# Patient Record
Sex: Female | Born: 1965 | Race: White | Hispanic: No | Marital: Single | Smoking: Former smoker
Health system: Southern US, Community
[De-identification: ages and names within clinical notes are randomized; demographics above are authoritative.]

## PROBLEM LIST (undated history)

## (undated) DIAGNOSIS — F32A Depression, unspecified: Secondary | ICD-10-CM

## (undated) DIAGNOSIS — I10 Essential (primary) hypertension: Secondary | ICD-10-CM

## (undated) DIAGNOSIS — I219 Acute myocardial infarction, unspecified: Secondary | ICD-10-CM

## (undated) DIAGNOSIS — B009 Herpesviral infection, unspecified: Secondary | ICD-10-CM

## (undated) DIAGNOSIS — I2542 Coronary artery dissection: Secondary | ICD-10-CM

## (undated) DIAGNOSIS — E210 Primary hyperparathyroidism: Secondary | ICD-10-CM

## (undated) DIAGNOSIS — F329 Major depressive disorder, single episode, unspecified: Secondary | ICD-10-CM

## (undated) DIAGNOSIS — F419 Anxiety disorder, unspecified: Secondary | ICD-10-CM

## (undated) DIAGNOSIS — G4730 Sleep apnea, unspecified: Secondary | ICD-10-CM

## (undated) DIAGNOSIS — R87629 Unspecified abnormal cytological findings in specimens from vagina: Secondary | ICD-10-CM

## (undated) DIAGNOSIS — R7303 Prediabetes: Secondary | ICD-10-CM

## (undated) HISTORY — DX: Sleep apnea, unspecified: G47.30

## (undated) HISTORY — DX: Herpesviral infection, unspecified: B00.9

## (undated) HISTORY — DX: Essential (primary) hypertension: I10

## (undated) HISTORY — DX: Depression, unspecified: F32.A

---
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
## 2003-11-23 ENCOUNTER — Inpatient Hospital Stay: Payer: Self-pay | Admitting: Obstetrics and Gynecology

## 2012-03-22 ENCOUNTER — Ambulatory Visit: Payer: Self-pay | Admitting: Family Medicine

## 2012-04-08 ENCOUNTER — Ambulatory Visit: Payer: Self-pay | Admitting: Family Medicine

## 2013-01-30 DIAGNOSIS — I2542 Coronary artery dissection: Secondary | ICD-10-CM

## 2013-01-30 HISTORY — DX: Coronary artery dissection: I25.42

## 2013-04-07 DIAGNOSIS — E7800 Pure hypercholesterolemia, unspecified: Secondary | ICD-10-CM | POA: Insufficient documentation

## 2013-04-10 ENCOUNTER — Ambulatory Visit: Payer: Self-pay | Admitting: Family Medicine

## 2013-07-13 DIAGNOSIS — G4733 Obstructive sleep apnea (adult) (pediatric): Secondary | ICD-10-CM | POA: Insufficient documentation

## 2013-10-03 ENCOUNTER — Observation Stay: Payer: Self-pay | Admitting: Internal Medicine

## 2013-10-03 DIAGNOSIS — I214 Non-ST elevation (NSTEMI) myocardial infarction: Secondary | ICD-10-CM | POA: Insufficient documentation

## 2013-10-03 DIAGNOSIS — I2542 Coronary artery dissection: Secondary | ICD-10-CM | POA: Insufficient documentation

## 2013-10-03 LAB — PROTIME-INR
INR: 1.1
Prothrombin Time: 13.9 secs (ref 11.5–14.7)

## 2013-10-03 LAB — DRUG SCREEN, URINE
Amphetamines, Ur Screen: NEGATIVE (ref ?–1000)
BARBITURATES, UR SCREEN: NEGATIVE (ref ?–200)
Benzodiazepine, Ur Scrn: NEGATIVE (ref ?–200)
COCAINE METABOLITE, UR ~~LOC~~: NEGATIVE (ref ?–300)
Cannabinoid 50 Ng, Ur ~~LOC~~: NEGATIVE (ref ?–50)
MDMA (ECSTASY) UR SCREEN: NEGATIVE (ref ?–500)
Methadone, Ur Screen: NEGATIVE (ref ?–300)
OPIATE, UR SCREEN: NEGATIVE (ref ?–300)
Phencyclidine (PCP) Ur S: NEGATIVE (ref ?–25)
Tricyclic, Ur Screen: NEGATIVE (ref ?–1000)

## 2013-10-03 LAB — CK-MB
CK-MB: 10.1 ng/mL — ABNORMAL HIGH (ref 0.5–3.6)
CK-MB: 13 ng/mL — ABNORMAL HIGH (ref 0.5–3.6)

## 2013-10-03 LAB — CBC
HCT: 41.2 % (ref 35.0–47.0)
HGB: 13.3 g/dL (ref 12.0–16.0)
MCH: 28.4 pg (ref 26.0–34.0)
MCHC: 32.3 g/dL (ref 32.0–36.0)
MCV: 88 fL (ref 80–100)
Platelet: 259 10*3/uL (ref 150–440)
RBC: 4.68 10*6/uL (ref 3.80–5.20)
RDW: 13.4 % (ref 11.5–14.5)
WBC: 10.2 10*3/uL (ref 3.6–11.0)

## 2013-10-03 LAB — COMPREHENSIVE METABOLIC PANEL
ALBUMIN: 3.5 g/dL (ref 3.4–5.0)
ALT: 26 U/L
Alkaline Phosphatase: 71 U/L
Anion Gap: 7 (ref 7–16)
BUN: 9 mg/dL (ref 7–18)
Bilirubin,Total: 0.7 mg/dL (ref 0.2–1.0)
Calcium, Total: 10.8 mg/dL — ABNORMAL HIGH (ref 8.5–10.1)
Chloride: 104 mmol/L (ref 98–107)
Co2: 26 mmol/L (ref 21–32)
Creatinine: 1.06 mg/dL (ref 0.60–1.30)
GLUCOSE: 150 mg/dL — AB (ref 65–99)
OSMOLALITY: 275 (ref 275–301)
POTASSIUM: 3.2 mmol/L — AB (ref 3.5–5.1)
SGOT(AST): 12 U/L — ABNORMAL LOW (ref 15–37)
SODIUM: 137 mmol/L (ref 136–145)
Total Protein: 7.5 g/dL (ref 6.4–8.2)

## 2013-10-03 LAB — TROPONIN I
TROPONIN-I: 2.1 ng/mL — AB
TROPONIN-I: 4.7 ng/mL — AB
Troponin-I: 0.04 ng/mL

## 2013-10-03 LAB — MAGNESIUM: MAGNESIUM: 1.8 mg/dL

## 2013-10-03 LAB — D-DIMER(ARMC): D-Dimer: 368 ng/ml

## 2013-10-03 LAB — HEPARIN LEVEL (UNFRACTIONATED): Anti-Xa(Unfractionated): <0.1 IU/mL — ABNORMAL LOW (ref 0.30–0.70)

## 2013-10-03 LAB — LIPASE, BLOOD: LIPASE: 75 U/L (ref 73–393)

## 2013-10-03 LAB — APTT: ACTIVATED PTT: 25.4 s (ref 23.6–35.9)

## 2014-02-01 ENCOUNTER — Ambulatory Visit: Payer: Self-pay | Admitting: Physician Assistant

## 2014-02-06 DIAGNOSIS — R079 Chest pain, unspecified: Secondary | ICD-10-CM | POA: Insufficient documentation

## 2014-05-23 NOTE — Discharge Summary (Signed)
PATIENT NAME:  Nicole Trevino, Nicole Trevino MR#:  [id number] DATE OF BIRTH:  1965-08-06  DATE OF ADMISSION:  10/03/2013 DATE OF DISCHARGE:    PRIMARY CARE PHYSICIAN: Dr. Gayland Curry.  CARDIOLOGIST: Dr. Clayborn Bigness.  The patient will be transferred to [facility] under the care of Dr. Dionne Ano  FINAL DIAGNOSES:  1. 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  2. Elevated troponin going along with myocardial infarction. Coronary arteries showed no plaque besides the dissection in the left anterior descending.  3. Hypertension.  4. Hypokalemia.  5. Sleep apnea and obesity on CPAP at night.   CURRENT MEDICATIONS: Prior to cardiac catheterization: Included aspirin 81 mg daily, metoprolol 12.5 mg daily, Lipitor 20 mg at bedtime, Dr. Clayborn Bigness made have added further medications. The patient takes acyclovir 200 mg twice a day and also a birth control pill once a day.    HOSPITAL COURSE: The patient was admitted 10/03/2013 with chest pain. Since chest pain was atypical, a CT scan of the chest was done to rule out pulmonary embolism. That was negative. Second troponin came back at 2.1. I spoke with Dr. Clayborn Bigness and took to the cardiac catheterization laboratory and saw a spontaneous dissection of the LAD. Because we do not have followup catheterization capabilities until Tuesday, September 8, Dr. Clayborn Bigness felt that it would be best to watch this patient at a tertiary care center just in case the patient has further chest pains. Dr. Dionne Ano at [facility] has accepted the patient and the patient will likely be transferred to [facility] on 10/03/2013.    TIME SPENT ON PATIENT CARE TODAY: 80 minutes.    ____________________________ Tana Conch. Leslye Peer, MD rjw:lt D: 10/03/2013 16:47:59 ET T: 10/03/2013 17:25:33 ET JOB#: [id number]  cc: Tana Conch. Leslye Peer, MD, <Dictator> Floria Raveling. Astrid Divine, MD Dwayne D. Clayborn Bigness, [place] MD ELECTRONICALLY SIGNED 10/14/2013 12:25

## 2014-05-23 NOTE — H&P (Signed)
PATIENT NAME:  Nicole Trevino, Nicole Trevino MR#:  [id number] DATE OF BIRTH:  20-May-1965  DATE OF ADMISSION:  10/03/2013  PRIMARY CARE PHYSICIAN: Floria Raveling. Astrid Divine, MD.   CHIEF COMPLAINT: Chest pain.   HISTORY OF PRESENT ILLNESS: This is a 49 year old female who walked her child to the bus stop and then walked back. She got a hot feeling, then made it in and sat on her couch and had her chest start hurting. A squeezing, sharp-type pain, 9/10 in intensity, unrelieving until EMS got nitroglycerin and that seemed to help. Currently 1/10 chest pain. No shortness of breath. Positive for nausea. No diaphoresis. She just started birth control pills last week for heavy menses and recently started on CPAP for sleep apnea.   PAST MEDICAL HISTORY:  1.  Sleep apnea on CPAP.  2.  Heavy menses.  3.  Hypertension.  4.  Herpes simplex.   PAST SURGICAL HISTORY: Two C-sections.   ALLERGIES: CEFTIN AND ERYTHROMYCIN.   SOCIAL HISTORY: Quit smoking 6 months ago. Occasional alcohol. No drug use. Works as a Art therapist.   FAMILY HISTORY: Father with hypertension. Mother with hypertension. Both grandfathers both died in their 68s of heart disease.   MEDICATIONS: Include Acyclovir 1 tablet twice a day, hydrochlorothiazide triamterene 25/37.5 one tablet daily, birth control pill 1 tablet daily.   REVIEW OF SYSTEMS: CONSTITUTIONAL: Positive for hot feeling. No fever or chills. Positive for weight gain. Positive for fatigue, but better with CPAP.  EYES: She does wear glasses.  EARS, NOSE, MOUTH AND THROAT: Positive for postnasal drip. No sore throat. No difficulty swallowing.  CARDIOVASCULAR: Positive for chest pain. No palpitations.  RESPIRATORY: No shortness of breath. No cough. No sputum. No hemoptysis.  GASTROINTESTINAL: Positive for nausea. No vomiting. Positive for lower abdominal pain with heavy menses.  GENITOURINARY: Positive for heavy menses, twice per month.   INTEGUMENTARY: Positive for rash on the left  shoulder.  NEUROLOGIC: No fainting or blackouts.  PSYCHIATRIC: No anxiety or depression.  ENDOCRINE: No thyroid problems.  HEMATOLOGIC AND LYMPHATIC: No anemia.   PHYSICAL EXAMINATION: VITAL SIGNS: Temperature 99, pulse 100, respirations 15, blood pressure 115/82, pulse oximetry 96% on room air.  GENERAL: No respiratory distress.  EYES: Conjunctivae and lids normal. Pupils equal, round, and reactive to light. Extraocular muscles intact. No nystagmus.  EARS, NOSE, MOUTH AND THROAT: Tympanic membranes, no erythema. Nasal mucosa, no erythema. Throat, no erythema, no exudate seen. Lips and gums, no lesions.  NECK: No JVD. No bruits. No lymphadenopathy. No thyromegaly. No thyroid nodules palpated.  RESPIRATORY: Clear to auscultation. No use of accessory muscles to breathe. No rhonchi, rales, or wheeze heard.  CARDIOVASCULAR SYSTEM: S1, S2 normal. No gallops, rubs, or murmurs heard. Carotid upstroke 2+ bilaterally. No bruits.  EXTREMITIES: Dorsalis pedis pulses 2+ bilaterally. No edema of the lower extremity.  ABDOMEN: Soft, nontender. No organomegaly, splenomegaly. Normoactive bowel sounds. No masses felt.  LYMPHATIC: No lymph nodes in the neck.  MUSCULOSKELETAL: No clubbing, edema, or cyanosis.  SKIN: No rashes or ulcers seen.  NEUROLOGIC: Cranial nerves II through XII grossly intact. Deep tendon reflexes 1+ bilateral lower extremities.  PSYCHIATRIC: The patient is oriented to person, place, and time.   LABORATORY AND RADIOLOGICAL DATA: Urine toxicology negative. D-dimer negative. First troponin negative. INR 1.1, magnesium 1.8, lipase 75. White blood cell count 10.2, hemoglobin and hematocrit 13.3 and 41.2, platelet count of 259,000. Glucose 150, BUN 9, creatinine 1.06, sodium 137, potassium 3.2, chloride 104, CO2 of 26, calcium 10.8. Liver function tests normal  range.   ASSESSMENT AND PLAN: 1.  Chest pain, some atypical features. Is a little reproducible when pressing over the chest, so it  could be musculoskeletal. Could be gastrointestinal-related with recently started on CPAP. Could be pulmonary embolism with recently started on birth control pills. Will get a CT scan to rule out pulmonary embolism. The ER physician started a heparin drip and given aspirin already. I will get a stress test in the a.m. if cardiac enzymes are negative and CT scan of the chest is negative.  2.  Hypokalemia. Will discontinue hydrochlorothiazide and replace potassium.  3.  Hypertension. Blood pressure currently stable.  4.  Sleep apnea and obesity. BMI 38.1. Weight loss needed. The patient will use CPAP at night.   TIME SPENT ON ADMISSION: 55 minutes.    ____________________________ Tana Conch. Leslye Peer, MD rjw:at D: 10/03/2013 14:23:33 ET T: 10/03/2013 14:38:38 ET JOB#: [id number]  cc: Tana Conch. Leslye Peer, MD, <Dictator> Floria Raveling. Astrid Divine, MD [place] MD ELECTRONICALLY SIGNED 10/08/2013 15:16

## 2015-03-17 ENCOUNTER — Other Ambulatory Visit: Payer: Self-pay | Admitting: Family Medicine

## 2015-03-17 DIAGNOSIS — Z1231 Encounter for screening mammogram for malignant neoplasm of breast: Secondary | ICD-10-CM

## 2015-04-12 ENCOUNTER — Ambulatory Visit
Admission: RE | Admit: 2015-04-12 | Discharge: 2015-04-12 | Disposition: A | Discharge status: Home / Self Care | Payer: PRIVATE HEALTH INSURANCE | Source: Ambulatory Visit | Attending: Family Medicine | Admitting: Family Medicine

## 2015-04-12 DIAGNOSIS — Z1231 Encounter for screening mammogram for malignant neoplasm of breast: Secondary | ICD-10-CM | POA: Insufficient documentation

## 2015-04-14 ENCOUNTER — Other Ambulatory Visit: Payer: Self-pay | Admitting: Family Medicine

## 2015-04-14 DIAGNOSIS — N6489 Other specified disorders of breast: Secondary | ICD-10-CM

## 2015-04-16 ENCOUNTER — Ambulatory Visit
Admission: RE | Admit: 2015-04-16 | Discharge: 2015-04-16 | Disposition: A | Discharge status: Home / Self Care | Payer: PRIVATE HEALTH INSURANCE | Source: Ambulatory Visit | Attending: Family Medicine | Admitting: Family Medicine

## 2015-04-16 DIAGNOSIS — N6489 Other specified disorders of breast: Secondary | ICD-10-CM

## 2016-03-31 ENCOUNTER — Other Ambulatory Visit: Payer: Self-pay | Admitting: Family Medicine

## 2016-03-31 DIAGNOSIS — Z1231 Encounter for screening mammogram for malignant neoplasm of breast: Secondary | ICD-10-CM

## 2016-04-12 ENCOUNTER — Ambulatory Visit
Admission: RE | Admit: 2016-04-12 | Discharge: 2016-04-12 | Disposition: A | Discharge status: Home / Self Care | Payer: PRIVATE HEALTH INSURANCE | Source: Ambulatory Visit | Attending: Family Medicine | Admitting: Family Medicine

## 2016-04-12 ENCOUNTER — Encounter: Payer: Self-pay | Admitting: Radiology

## 2016-04-12 DIAGNOSIS — Z1231 Encounter for screening mammogram for malignant neoplasm of breast: Secondary | ICD-10-CM | POA: Diagnosis present

## 2018-04-12 DIAGNOSIS — E559 Vitamin D deficiency, unspecified: Secondary | ICD-10-CM | POA: Insufficient documentation

## 2018-04-12 DIAGNOSIS — E349 Endocrine disorder, unspecified: Secondary | ICD-10-CM | POA: Insufficient documentation

## 2018-04-18 ENCOUNTER — Other Ambulatory Visit: Payer: Self-pay | Admitting: Family Medicine

## 2018-04-18 DIAGNOSIS — Z1231 Encounter for screening mammogram for malignant neoplasm of breast: Secondary | ICD-10-CM

## 2018-05-01 DIAGNOSIS — E210 Primary hyperparathyroidism: Secondary | ICD-10-CM | POA: Insufficient documentation

## 2018-05-01 DIAGNOSIS — R5383 Other fatigue: Secondary | ICD-10-CM | POA: Insufficient documentation

## 2018-08-23 ENCOUNTER — Ambulatory Visit
Admission: RE | Admit: 2018-08-23 | Discharge: 2018-08-23 | Disposition: A | Discharge status: Home / Self Care | Payer: PRIVATE HEALTH INSURANCE | Source: Ambulatory Visit | Attending: Family Medicine | Admitting: Family Medicine

## 2018-08-23 DIAGNOSIS — Z1231 Encounter for screening mammogram for malignant neoplasm of breast: Secondary | ICD-10-CM | POA: Diagnosis not present

## 2018-10-03 ENCOUNTER — Encounter: Payer: Self-pay | Admitting: General Surgery

## 2018-10-03 ENCOUNTER — Other Ambulatory Visit: Payer: Self-pay

## 2018-10-03 ENCOUNTER — Ambulatory Visit (INDEPENDENT_AMBULATORY_CARE_PROVIDER_SITE_OTHER): Payer: PRIVATE HEALTH INSURANCE | Admitting: General Surgery

## 2018-10-03 VITALS — BP 139/90 | HR 88 | Temp 98.4°F | Resp 16 | Ht 64.0 in | Wt 254.4 lb

## 2018-10-03 DIAGNOSIS — E210 Primary hyperparathyroidism: Secondary | ICD-10-CM | POA: Diagnosis not present

## 2018-10-03 DIAGNOSIS — B009 Herpesviral infection, unspecified: Secondary | ICD-10-CM | POA: Insufficient documentation

## 2018-10-03 DIAGNOSIS — I10 Essential (primary) hypertension: Secondary | ICD-10-CM | POA: Insufficient documentation

## 2018-10-03 NOTE — Progress Notes (Signed)
Patient ID: Nicole Trevino, female   DOB: 1965-04-12, 53 y.o.   MRN: [id number]  Chief Complaint  Patient presents with  . New Patient (Initial Visit)    Parathyroidectomy    HPI  She is a 53 year old woman who was referred by Dr. Lavone Orn for surgical evaluation of primary hyperparathyroidism.  Trevino states that her primary care provider had done some routine lab work and found that her vitamin D was low.  She was given oral supplementation (weekly per the patient so likely ergocalciferol).  Apparently this did not improve her vitamin D levels to any significant extent.  Dr. Iona Beard then sent Nicole Trevino to see Dr. Elisabeth Cara.  The patient states that Dr. Gabriel Carina did an ultrasound and identified an enlarged "thyroid nodule outside the thyroid."  Upon further prompting, the patient recalls that her calcium has also been elevated along with her parathyroid hormone.  She has never taken hydrochlorothiazide nor any calcium supplements.  She does not use Tums as an antacid.  Nicole Trevino reports a history of severe obstructive sleep apnea but since initiating CPAP, she has been sleeping very well.  She feels like recently, she has been excessively fatigued, similar to how she felt prior to treatment of her sleep apnea.  She endorses feeling sleepy midmorning and has fatigue and minimal energy at work.  She endorses constipation, polydipsia, and polyuria.  She has never had pancreatitis nor nephrolithiasis.  She denies gastroesophageal reflux disease or peptic ulcer disease.  She endorses foggy thinking and general body/bone aching.  She denies any known family history of jaw tumor syndromes, hypercalcemia, chronic nephrolithiasis, or inherited endocrinopathies.  She has never had a pathologic fracture.  She has never had a bone density study.  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  She has not had any  therapeutic head and neck irradiation.     Past Medical History:  Diagnosis Date  . Depression   . Herpes    nasal and oral area  . Hypertension   . Sleep apnea    severe, per patient.  Marland Kitchen Spontaneous dissection of coronary artery 2015    Past Surgical History:  Procedure Laterality Date  . CESAREAN SECTION     x 2    Family History  Problem Relation Age of Onset  . Lung cancer Father   . Breast cancer Neg Hx   No known history of heritable endocrinopathies.  Social History Social History   Tobacco Use  . Smoking status: Former Research scientist (life sciences)  . Smokeless tobacco: Never Used  Substance Use Topics  . Alcohol use: Never    Frequency: Never  . Drug use: Never    Allergies  Allergen Reactions  . Lisinopril Cough  . Cefuroxime Axetil Hives and Rash  . Erythromycin Hives and Rash    Current Outpatient Medications  Medication Sig Dispense Refill  . acyclovir (ZOVIRAX) 400 MG tablet Take 400 mg by mouth 5 (five) times daily.    Marland Kitchen aspirin EC 81 MG tablet Take 81 mg by mouth daily.    Marland Kitchen atorvastatin (LIPITOR) 80 MG tablet Take 80 mg by mouth daily.    . ergocalciferol (VITAMIN D2) 1.25 MG (50000 UT) capsule Take 50,000 Units by mouth once a week.    . magnesium oxide (MAG-OX) 400 MG tablet Take 400 mg by mouth daily.    . metoprolol tartrate (LOPRESSOR) 25 MG tablet Take 25  mg by mouth 2 (two) times daily.     No current facility-administered medications for this visit.     Review of Systems Review of Systems  All other systems reviewed and are negative. Are as discussed in the history of present illness  Blood pressure 139/90, pulse 88, temperature 98.4 F (36.9 C), temperature source Temporal, resp. rate 16, height 5\' 4"  (1.626 m), weight 254 lb 6.4 oz (115.4 kg), last menstrual period 01/08/2018, SpO2 96 %. Today's Vitals   10/03/18 1512  BP: 139/90  Pulse: 88  Resp: 16  Temp: 98.4 F (36.9 C)  TempSrc: Temporal  SpO2: 96%  Weight: 254 lb 6.4 oz (115.4 kg)   Height: 5\' 4"  (1.626 m)  PainSc: 0-No pain   Body mass index is 43.67 kg/m.  Physical Exam Physical Exam Constitutional:      General: She is not in acute distress.    Appearance: Normal appearance. She is obese.  HENT:     Head: Normocephalic and atraumatic.     Nose:     Comments: Covered with a mask secondary to COVID-19 precautions    Mouth/Throat:     Comments: Covered with a mask secondary to COVID-19 precautions Eyes:     General: No scleral icterus.       Right eye: No discharge.        Left eye: No discharge.     Conjunctiva/sclera: Conjunctivae normal.  Neck:     Musculoskeletal: Normal range of motion and neck supple.     Comments: No palpable thyromegaly or dominant thyroid masses. Cardiovascular:     Rate and Rhythm: Normal rate and regular rhythm.     Pulses: Normal pulses.  Pulmonary:     Effort: Pulmonary effort is normal.     Breath sounds: Normal breath sounds.  Abdominal:     General: Bowel sounds are normal. There is no distension.     Palpations: Abdomen is soft.     Comments: Protuberant, consistent with her level of obesity  Genitourinary:    Comments: Deferred Musculoskeletal: Normal range of motion.        General: No deformity.  Lymphadenopathy:     Cervical: No cervical adenopathy.  Skin:    General: Skin is warm and dry.  Neurological:     General: No focal deficit present.     Mental Status: She is alert and oriented to person, place, and time.  Psychiatric:        Behavior: Behavior normal.     Data Reviewed I reviewed the labs obtained by Dr. Gabriel Carina, via the Care Everywhere function in Epic.  These demonstrate that her calcium over the past year or so has run between 10.2-10.8.  Her renal function is normal, as is alkaline phosphatase.  Nicole Trevino was replete, as of July 05, 2018.  Serum protein electrophoresis and urine protein electrophoresis were normal.  Intact PTH has been elevated with the most recent value being 71 in the context of  a serum calcium of 10.5.  It has been as high as 179.  I also reviewed the report of the ultrasound that demonstrates a couple of small thyroid nodules that do not have any concerning features.  Adjacent to the inferior pole of the right lobe is a 1.22 x 1.17 x 0.77 cm mass, consistent with an enlarged parathyroid gland.  The imaging itself, is not available for my review.  Assessment This is a 53 year old woman with biochemical evidence of primary hyperparathyroidism.  She also has  a number of secondary symptoms most prominent of which is severe fatigue.  Based upon her young age, though she is older than 48, I think she would benefit from parathyroidectomy.  Plan The risks of parathyroid surgery were discussed with the patient, including (but not limited to): bleeding, infection, damage to surrounding structures/tissues, injury (temporary or permanent) to the recurrent laryngeal nerve, hypocalcemia (temporary or permanent), need to take calcium and/or vitamin D supplementation, recurrent hyperparathyroidism, failure to correct hyperparathyroidism, need for additional surgery.  The patient had the opportunity to ask any questions and these were answered to their satisfaction.  She would like to proceed and we will get her scheduled for an operation.    Fredirick Maudlin 10/03/2018, 5:35 PM

## 2018-10-03 NOTE — Patient Instructions (Addendum)
Our surgery scheduler will call you within 24-48 hours to schedule your surgery.     Please call our office if you have questions or concerns.   Parathyroidectomy  A parathyroidectomy is a surgery to remove one or more parathyroid glands. These glands are in the neck. Each gland is very small, about the size of a pea. Most people have four parathyroid glands. The glands produce parathyroid hormone, which helps to control the level of calcium in the body. You may have a parathyroidectomy if your body produces too much parathyroid hormone (hyperparathyroidism). This usually occurs when one or more of your parathyroid glands becomes enlarged from a type of noncancerous tumor (adenoma). Tell a health care provider about:  Any allergies you have.  All medicines you are taking, including vitamins, herbs, eye drops, creams, and over-the-counter medicines.  Any problems you or family members have had with anesthetic medicines.  Any blood disorders you have.  Any surgeries you have had.  Any medical conditions you have.  Whether you are pregnant or may be pregnant. What are the risks? Generally, this is a safe procedure. However, problems may occur, including:  Bleeding.  Infection.  Allergic reactions to medicines.  Damage to the nerves of your voice box (larynx). This can be temporary or long-term (rare).  Damage to nearby structures and organs, such as the skin (scarring), surrounding blood vessels, and nerves in the neck.  Hoarseness. This usually resolves in 24-48 hours.  A condition in which your body does not make enough parathyroid hormone (hypoparathyroidism). This is rare.  Difficulty breathing. This is rare. What happens before the procedure? Staying hydrated Follow instructions from your health care provider about hydration, which may include:  Up to 2 hours before the procedure - you may continue to drink clear liquids, such as water, clear fruit juice, black  coffee, and plain tea. Eating and drinking restrictions Follow instructions from your health care provider about eating and drinking, which may include:  8 hours before the procedure - stop eating heavy meals or foods such as meat, fried foods, or fatty foods.  6 hours before the procedure - stop eating light meals or foods, such as toast or cereal.  6 hours before the procedure - stop drinking milk or drinks that contain milk.  2 hours before the procedure - stop drinking clear liquids. Medicines Ask your health care provider about:  Changing or stopping your regular medicines. This is especially important if you are taking diabetes medicines or blood thinners.  Taking medicines such as aspirin and ibuprofen. These medicines can thin your blood. Do not take these medicines unless your health care provider tells you to take them.  Taking over-the-counter medicines, vitamins, herbs, and supplements. General instructions  You may be asked to shower with a germ-killing soap.  Plan to have someone take you home from the hospital or clinic.  Plan to have a responsible adult care for you for at least 24 hours after you leave the hospital or clinic. This is important. What happens during the procedure?  To lower your risk of infection: ? Your health care team will wash or sanitize their hands. ? Hair may be removed from the surgical area. ? Your skin will be washed with soap.  An IV will be inserted into one of your veins.  You will be given one or more of the following: ? A medicine to help you relax (sedative). ? A medicine to make you fall asleep (general anesthetic).  An  incision will be made according to the type of parathyroidectomy procedure you are having. There are four methods that may be used: ? Open surgery. A single incision will be made in the center of your neck. The incision will be about 2-4 inches long. ? Minimally invasive surgery. A small incision will be made in  the side of your neck. This incision will be about 1-2 inches long. Before the procedure, you might be given an injection of a type of medicine that will help the surgeon to locate the gland. ? Video-assisted surgery. Two small incisions will be made in your neck. One incision is for the instruments that will be used to remove the gland. The other incision is for a tiny camera that will help the surgeon to see inside your neck. ? Endoscopic surgery. An incision will be made just above your collarbone. A small, flexible tube (endoscope) will be inserted through this incision.  Your health care provider may monitor laryngeal nerve function during the procedure for safety reasons.  The gland or glands that are causing problems will be removed.  The incisions will be closed using stitches (sutures) or other methods. The sutures will often be hidden under the skin. The procedure may vary among health care providers and hospitals. What happens after the procedure?  Your blood pressure, heart rate, breathing rate, and blood oxygen level will be monitored until the medicines you were given have worn off.  You will be given pain medicine as needed.  Your provider will check your ability to talk and swallow after the procedure.  You will gradually start to drink liquids and have soft foods as tolerated.  Your blood will be tested to check the calcium level in your body.  Do not drive for 24 hours if you were given a sedative during your procedure. Summary  The parathyroid glands are located in the neck and produce parathyroid hormone, which helps to control the level of calcium in the body.  A parathyroidectomy is a surgery to remove one or more parathyroid glands.  You may have a parathyroidectomy if your body produces too much parathyroid hormone (hyperparathyroidism).  There are four surgical methods that may be used for a parathyroidectomy: open, minimally invasive, video-assisted, and  endoscopic.  Generally, this is a safe procedure. However, problems may occur, including bleeding, infection, and a hoarse or weak voice. This information is not intended to replace advice given to you by your health care provider. Make sure you discuss any questions you have with your health care provider. Document Released: 04/14/2008 Document Revised: 12/29/2016 Document Reviewed: 11/21/2016 Elsevier Patient Education  2020 [employer].

## 2018-10-03 NOTE — H&P (View-Only) (Signed)
Patient ID: Nicole Trevino, female   DOB: 09/26/1965, 53 y.o.   MRN: [id number]  Chief Complaint  Patient presents with  . New Patient (Initial Visit)    Parathyroidectomy    HPI  She is a 53 year old woman who was referred by Dr. Lavone Orn for surgical evaluation of primary hyperparathyroidism.  [name] states that her primary care provider had done some routine lab work and found that her vitamin D was low.  She was given oral supplementation (weekly per the patient so likely ergocalciferol).  Apparently this did not improve her vitamin D levels to any significant extent.  Dr. Iona Beard then sent Nicole Trevino to see Dr. Elisabeth Cara.  The patient states that Dr. Gabriel Carina did an ultrasound and identified an enlarged "thyroid nodule outside the thyroid."  Upon further prompting, the patient recalls that her calcium has also been elevated along with her parathyroid hormone.  She has never taken hydrochlorothiazide nor any calcium supplements.  She does not use Tums as an antacid.  Nicole Trevino reports a history of severe obstructive sleep apnea but since initiating CPAP, she has been sleeping very well.  She feels like recently, she has been excessively fatigued, similar to how she felt prior to treatment of her sleep apnea.  She endorses feeling sleepy midmorning and has fatigue and minimal energy at work.  She endorses constipation, polydipsia, and polyuria.  She has never had pancreatitis nor nephrolithiasis.  She denies gastroesophageal reflux disease or peptic ulcer disease.  She endorses foggy thinking and general body/bone aching.  She denies any known family history of jaw tumor syndromes, hypercalcemia, chronic nephrolithiasis, or inherited endocrinopathies.  She has never had a pathologic fracture.  She has never had a bone density study.  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  She has not had any  therapeutic head and neck irradiation.     Past Medical History:  Diagnosis Date  . Depression   . Herpes    nasal and oral area  . Hypertension   . Sleep apnea    severe, per patient.  Marland Kitchen Spontaneous dissection of coronary artery 2015    Past Surgical History:  Procedure Laterality Date  . CESAREAN SECTION     x 2    Family History  Problem Relation Age of Onset  . Lung cancer Father   . Breast cancer Neg Hx   No known history of heritable endocrinopathies.  Social History Social History   Tobacco Use  . Smoking status: Former Research scientist (life sciences)  . Smokeless tobacco: Never Used  Substance Use Topics  . Alcohol use: Never    Frequency: Never  . Drug use: Never    Allergies  Allergen Reactions  . Lisinopril Cough  . Cefuroxime Axetil Hives and Rash  . Erythromycin Hives and Rash    Current Outpatient Medications  Medication Sig Dispense Refill  . acyclovir (ZOVIRAX) 400 MG tablet Take 400 mg by mouth 5 (five) times daily.    Marland Kitchen aspirin EC 81 MG tablet Take 81 mg by mouth daily.    Marland Kitchen atorvastatin (LIPITOR) 80 MG tablet Take 80 mg by mouth daily.    . ergocalciferol (VITAMIN D2) 1.25 MG (50000 UT) capsule Take 50,000 Units by mouth once a week.    . magnesium oxide (MAG-OX) 400 MG tablet Take 400 mg by mouth daily.    . metoprolol tartrate (LOPRESSOR) 25 MG tablet Take 25  mg by mouth 2 (two) times daily.     No current facility-administered medications for this visit.     Review of Systems Review of Systems  All other systems reviewed and are negative. Are as discussed in the history of present illness  Blood pressure 139/90, pulse 88, temperature 98.4 F (36.9 C), temperature source Temporal, resp. rate 16, height 5\' 4"  (1.626 m), weight 254 lb 6.4 oz (115.4 kg), last menstrual period 01/08/2018, SpO2 96 %. Today's Vitals   10/03/18 1512  BP: 139/90  Pulse: 88  Resp: 16  Temp: 98.4 F (36.9 C)  TempSrc: Temporal  SpO2: 96%  Weight: 254 lb 6.4 oz (115.4 kg)   Height: 5\' 4"  (1.626 m)  PainSc: 0-No pain   Body mass index is 43.67 kg/m.  Physical Exam Physical Exam Constitutional:      General: She is not in acute distress.    Appearance: Normal appearance. She is obese.  HENT:     Head: Normocephalic and atraumatic.     Nose:     Comments: Covered with a mask secondary to COVID-19 precautions    Mouth/Throat:     Comments: Covered with a mask secondary to COVID-19 precautions Eyes:     General: No scleral icterus.       Right eye: No discharge.        Left eye: No discharge.     Conjunctiva/sclera: Conjunctivae normal.  Neck:     Musculoskeletal: Normal range of motion and neck supple.     Comments: No palpable thyromegaly or dominant thyroid masses. Cardiovascular:     Rate and Rhythm: Normal rate and regular rhythm.     Pulses: Normal pulses.  Pulmonary:     Effort: Pulmonary effort is normal.     Breath sounds: Normal breath sounds.  Abdominal:     General: Bowel sounds are normal. There is no distension.     Palpations: Abdomen is soft.     Comments: Protuberant, consistent with her level of obesity  Genitourinary:    Comments: Deferred Musculoskeletal: Normal range of motion.        General: No deformity.  Lymphadenopathy:     Cervical: No cervical adenopathy.  Skin:    General: Skin is warm and dry.  Neurological:     General: No focal deficit present.     Mental Status: She is alert and oriented to person, place, and time.  Psychiatric:        Behavior: Behavior normal.     Data Reviewed I reviewed the labs obtained by Dr. Gabriel Carina, via the Care Everywhere function in Epic.  These demonstrate that her calcium over the past year or so has run between 10.2-10.8.  Her renal function is normal, as is alkaline phosphatase.  Nicole Trevino was replete, as of July 05, 2018.  Serum protein electrophoresis and urine protein electrophoresis were normal.  Intact PTH has been elevated with the most recent value being 71 in the context of  a serum calcium of 10.5.  It has been as high as 179.  I also reviewed the report of the ultrasound that demonstrates a couple of small thyroid nodules that do not have any concerning features.  Adjacent to the inferior pole of the right lobe is a 1.22 x 1.17 x 0.77 cm mass, consistent with an enlarged parathyroid gland.  The imaging itself, is not available for my review.  Assessment This is a 53 year old woman with biochemical evidence of primary hyperparathyroidism.  She also has  a number of secondary symptoms most prominent of which is severe fatigue.  Based upon her young age, though she is older than 69, I think she would benefit from parathyroidectomy.  Plan The risks of parathyroid surgery were discussed with the patient, including (but not limited to): bleeding, infection, damage to surrounding structures/tissues, injury (temporary or permanent) to the recurrent laryngeal nerve, hypocalcemia (temporary or permanent), need to take calcium and/or vitamin D supplementation, recurrent hyperparathyroidism, failure to correct hyperparathyroidism, need for additional surgery.  The patient had the opportunity to ask any questions and these were answered to their satisfaction.  She would like to proceed and we will get her scheduled for an operation.    Fredirick Maudlin 10/03/2018, 5:35 PM

## 2018-10-03 NOTE — Addendum Note (Signed)
Addended by: Fredirick Maudlin on: 10/03/2018 05:48 PM   Modules accepted: Orders, SmartSet

## 2018-10-08 ENCOUNTER — Telehealth: Payer: Self-pay | Admitting: *Deleted

## 2018-10-08 NOTE — Telephone Encounter (Signed)
Pt advised of pre op date/time and sx date. Sx: 10/16/18 with Dr Sherin Quarry.  Pre op: 10/10/18 between 1-5:00pm-phone interview.  Covid testing to be done at the [facility] on 10/11/18 between 8-10:30am.  Patient made aware to call [phone], between 1-3:00pm the day before surgery, to find out what time to arrive.    Emmi video sent to patient via email.

## 2018-10-08 NOTE — Telephone Encounter (Signed)
Patient called and stated that she was scheduled for surgery on 10/16/18 with Dr.Cannon but was unaware of it. Patient would like a call back with more details.

## 2018-10-10 ENCOUNTER — Encounter
Admission: RE | Admit: 2018-10-10 | Discharge: 2018-10-10 | Disposition: A | Discharge status: Home / Self Care | Payer: PRIVATE HEALTH INSURANCE | Source: Ambulatory Visit | Attending: General Surgery | Admitting: General Surgery

## 2018-10-10 ENCOUNTER — Other Ambulatory Visit: Payer: Self-pay

## 2018-10-10 HISTORY — DX: Acute myocardial infarction, unspecified: I21.9

## 2018-10-10 NOTE — Patient Instructions (Addendum)
Your procedure is scheduled on: Wednesday 10/16/18 Report to [facility]. To find out your arrival time please call [phone] between 1PM - 3PM on Tuesday 10/15/18.  Remember: Instructions that are not followed completely may result in serious medical risk, up to and including death, or upon the discretion of your surgeon and anesthesiologist your surgery may need to be rescheduled.     _X__ 1. Do not eat food after midnight the night before your procedure.                 No gum chewing or hard candies. You may drink clear liquids up to 2 hours                 before you are scheduled to arrive for your surgery- DO not drink clear                 liquids within 2 hours of the start of your surgery.                 Clear Liquids include:  water, apple juice without pulp, clear carbohydrate                 drink such as Clearfast or Gatorade, Black Coffee or Tea (Do not add                 anything to coffee or tea). Diabetics water only  __X__2.  On the morning of surgery brush your teeth with toothpaste and water, you                 may rinse your mouth with mouthwash if you wish.  Do not swallow any              toothpaste of mouthwash.     _X__ 3.  No Alcohol for 24 hours before or after surgery.   _X__ 4.  Do Not Smoke or use e-cigarettes For 24 Hours Prior to Your Surgery.                 Do not use any chewable tobacco products for at least 6 hours prior to                 surgery.  ____  5.  Bring all medications with you on the day of surgery if instructed.   __X__  6.  Notify your doctor if there is any change in your medical condition      (cold, fever, infections).     Do not wear jewelry, make-up, hairpins, clips or nail polish. Do not wear lotions, powders, or perfumes.  Do not shave 48 hours prior to surgery. Men may shave face and neck. Do not bring valuables to the hospital.    [facility] is not responsible for  any belongings or valuables.  Contacts, dentures/partials or body piercings may not be worn into surgery. Bring a case for your contacts, glasses or hearing aids, a denture cup will be supplied. Leave your suitcase in the car. After surgery it may be brought to your room. For patients admitted to the hospital, discharge time is determined by your treatment team.   Patients discharged the day of surgery will not be allowed to drive home.   Please read over the following fact sheets that you were given:   MRSA Information  __X__ Take these medicines the morning of surgery with A SIP OF WATER:  1. acyclovir (ZOVIRAX  2. atorvastatin (LIPITOR)   3. metoprolol tartrate (LOPRESSOR  4.  5.  6.  ____ Fleet Enema (as directed)   __X__ Use CHG Soap/SAGE wipes as directed  ____ Use inhalers on the day of surgery  ____ Stop metformin/Janumet/Farxiga 2 days prior to surgery    ____ Take 1/2 of usual insulin dose the night before surgery. No insulin the morning          of surgery.   __XX__ Stop Blood Thinners Coumadin/Plavix/Xarelto/Pleta/Pradaxa/Eliquis/Effient/Aspirin    Or contact your Surgeon, Cardiologist or Medical Doctor regarding  ability to stop your blood thinners  __X__ Stop Anti-inflammatories 7 days before surgery such as Advil, Ibuprofen, Motrin,  BC or Goodies Powder, Naprosyn, Naproxen, Aleve   OK TO CONTINUE TYLENOL/ACETAMINOPHEN  __X__ Stop all herbal supplements, fish oil or vitamin E until after surgery.  OK TO CONTINUE VITAMIN D  __X__ Bring C-Pap to the hospital.      TELEPHONE INTERVIEW. INSTRUCTIONS PROVIDED. PATIENT VERBALIZED UNDERSTANDING

## 2018-10-11 ENCOUNTER — Other Ambulatory Visit: Payer: PRIVATE HEALTH INSURANCE

## 2018-10-11 ENCOUNTER — Other Ambulatory Visit: Admission: RE | Admit: 2018-10-11 | Payer: PRIVATE HEALTH INSURANCE | Source: Ambulatory Visit

## 2018-10-11 ENCOUNTER — Encounter
Admission: RE | Admit: 2018-10-11 | Discharge: 2018-10-11 | Disposition: A | Discharge status: Home / Self Care | Payer: PRIVATE HEALTH INSURANCE | Source: Ambulatory Visit | Attending: General Surgery | Admitting: General Surgery

## 2018-10-11 DIAGNOSIS — I10 Essential (primary) hypertension: Secondary | ICD-10-CM | POA: Diagnosis not present

## 2018-10-11 DIAGNOSIS — Z20828 Contact with and (suspected) exposure to other viral communicable diseases: Secondary | ICD-10-CM | POA: Diagnosis not present

## 2018-10-11 DIAGNOSIS — Z01812 Encounter for preprocedural laboratory examination: Secondary | ICD-10-CM | POA: Diagnosis present

## 2018-10-11 DIAGNOSIS — I498 Other specified cardiac arrhythmias: Secondary | ICD-10-CM | POA: Insufficient documentation

## 2018-10-11 LAB — CBC
HCT: 43.4 % (ref 36.0–46.0)
Hemoglobin: 14 g/dL (ref 12.0–15.0)
MCH: 29.4 pg (ref 26.0–34.0)
MCHC: 32.3 g/dL (ref 30.0–36.0)
MCV: 91.2 fL (ref 80.0–100.0)
Platelets: 239 10*3/uL (ref 150–400)
RBC: 4.76 MIL/uL (ref 3.87–5.11)
RDW: 13.1 % (ref 11.5–15.5)
WBC: 7.5 10*3/uL (ref 4.0–10.5)
nRBC: 0 % (ref 0.0–0.2)

## 2018-10-11 LAB — BASIC METABOLIC PANEL
Anion gap: 8 (ref 5–15)
BUN: 13 mg/dL (ref 6–20)
CO2: 25 mmol/L (ref 22–32)
Calcium: 10.3 mg/dL (ref 8.9–10.3)
Chloride: 107 mmol/L (ref 98–111)
Creatinine, Ser: 0.69 mg/dL (ref 0.44–1.00)
GFR calc Af Amer: >60 mL/min (ref >60)
GFR calc non Af Amer: >60 mL/min (ref >60)
Glucose, Bld: 124 mg/dL — ABNORMAL HIGH (ref 70–99)
Potassium: 4 mmol/L (ref 3.5–5.1)
Sodium: 140 mmol/L (ref 135–145)

## 2018-10-11 NOTE — Pre-Procedure Instructions (Signed)
Notified Tiffany at [facility] pt's urine culture has been lost. Please ask Dr Marry Guan if he would like [place] to repeat sample or wait recollect DOS T

## 2018-10-12 LAB — SARS CORONAVIRUS 2 (TAT 6-24 HRS): SARS Coronavirus 2: NEGATIVE

## 2018-10-15 ENCOUNTER — Telehealth: Payer: Self-pay | Admitting: General Surgery

## 2018-10-15 NOTE — Telephone Encounter (Signed)
I have faxed a Cardiac Clearance form to [facility] - per the request from Anesthesia due to an abnormal EKG. I have spoken to Vivien Rota at [facility] [phone] and she is following up with the provider at this time. She has received our clearance form.

## 2018-10-16 ENCOUNTER — Other Ambulatory Visit: Payer: Self-pay

## 2018-10-16 ENCOUNTER — Ambulatory Visit
Admission: RE | Admit: 2018-10-16 | Discharge: 2018-10-16 | Disposition: A | Discharge status: Home / Self Care | Payer: PRIVATE HEALTH INSURANCE | Attending: General Surgery | Admitting: General Surgery

## 2018-10-16 ENCOUNTER — Encounter
Admission: RE | Disposition: A | Discharge status: Home / Self Care | Payer: Self-pay | Source: Home / Self Care | Attending: General Surgery

## 2018-10-16 ENCOUNTER — Ambulatory Visit: Payer: PRIVATE HEALTH INSURANCE | Admitting: Anesthesiology

## 2018-10-16 DIAGNOSIS — Z881 Allergy status to other antibiotic agents status: Secondary | ICD-10-CM | POA: Insufficient documentation

## 2018-10-16 DIAGNOSIS — E210 Primary hyperparathyroidism: Secondary | ICD-10-CM | POA: Diagnosis present

## 2018-10-16 DIAGNOSIS — K5900 Constipation, unspecified: Secondary | ICD-10-CM | POA: Diagnosis not present

## 2018-10-16 DIAGNOSIS — Z888 Allergy status to other drugs, medicaments and biological substances status: Secondary | ICD-10-CM | POA: Insufficient documentation

## 2018-10-16 DIAGNOSIS — R358 Other polyuria: Secondary | ICD-10-CM | POA: Insufficient documentation

## 2018-10-16 DIAGNOSIS — Z7982 Long term (current) use of aspirin: Secondary | ICD-10-CM | POA: Insufficient documentation

## 2018-10-16 DIAGNOSIS — Z801 Family history of malignant neoplasm of trachea, bronchus and lung: Secondary | ICD-10-CM | POA: Diagnosis not present

## 2018-10-16 DIAGNOSIS — F329 Major depressive disorder, single episode, unspecified: Secondary | ICD-10-CM | POA: Diagnosis not present

## 2018-10-16 DIAGNOSIS — D351 Benign neoplasm of parathyroid gland: Secondary | ICD-10-CM | POA: Insufficient documentation

## 2018-10-16 DIAGNOSIS — R631 Polydipsia: Secondary | ICD-10-CM | POA: Insufficient documentation

## 2018-10-16 DIAGNOSIS — Z79899 Other long term (current) drug therapy: Secondary | ICD-10-CM | POA: Diagnosis not present

## 2018-10-16 DIAGNOSIS — I10 Essential (primary) hypertension: Secondary | ICD-10-CM | POA: Insufficient documentation

## 2018-10-16 DIAGNOSIS — Z87891 Personal history of nicotine dependence: Secondary | ICD-10-CM | POA: Insufficient documentation

## 2018-10-16 DIAGNOSIS — G4733 Obstructive sleep apnea (adult) (pediatric): Secondary | ICD-10-CM | POA: Insufficient documentation

## 2018-10-16 HISTORY — PX: PARATHYROIDECTOMY: SHX19

## 2018-10-16 LAB — PARATHYROID HORMONE, INTRAOP (ARMC ONLY)
Parathyroid Hormone: 130 pg/mL — ABNORMAL HIGH (ref 12–88)
Parathyroid Hormone: 149 pg/mL — ABNORMAL HIGH (ref 12–88)
Parathyroid Hormone: 42 pg/mL (ref 12–88)
Parathyroid Hormone: 57 pg/mL (ref 12–88)

## 2018-10-16 LAB — POCT PREGNANCY, URINE: Preg Test, Ur: NEGATIVE

## 2018-10-16 SURGERY — PARATHYROIDECTOMY
Anesthesia: General

## 2018-10-16 MED ORDER — DEXMEDETOMIDINE HCL 200 MCG/2ML IV SOLN
INTRAVENOUS | Status: DC | PRN
  Administered 2018-10-16: 16 ug via INTRAVENOUS
  Administered 2018-10-16 (×2): 10 ug via INTRAVENOUS

## 2018-10-16 MED ORDER — LIDOCAINE HCL (CARDIAC) PF 100 MG/5ML IV SOSY
PREFILLED_SYRINGE | INTRAVENOUS | Status: DC | PRN
  Administered 2018-10-16: 100 mg via INTRAVENOUS

## 2018-10-16 MED ORDER — PHENYLEPHRINE HCL (PRESSORS) 10 MG/ML IV SOLN
INTRAVENOUS | Status: DC | PRN
  Administered 2018-10-16 (×2): 100 ug via INTRAVENOUS

## 2018-10-16 MED ORDER — ACETAMINOPHEN 500 MG PO TABS
ORAL_TABLET | ORAL | Status: AC
  Administered 2018-10-16: 07:00:00 1000 mg via ORAL
  Filled 2018-10-16: qty 2

## 2018-10-16 MED ORDER — LIDOCAINE HCL (PF) 2 % IJ SOLN
INTRAMUSCULAR | Status: AC
  Filled 2018-10-16: qty 10

## 2018-10-16 MED ORDER — HEMOSTATIC AGENTS (NO CHARGE) OPTIME
TOPICAL | Status: DC | PRN
  Administered 2018-10-16: 1 via TOPICAL

## 2018-10-16 MED ORDER — FAMOTIDINE 20 MG PO TABS
ORAL_TABLET | ORAL | Status: AC
  Administered 2018-10-16: 07:00:00 20 mg via ORAL
  Filled 2018-10-16: qty 1

## 2018-10-16 MED ORDER — DEXAMETHASONE SODIUM PHOSPHATE 10 MG/ML IJ SOLN
INTRAMUSCULAR | Status: DC | PRN
  Administered 2018-10-16: 10 mg via INTRAVENOUS

## 2018-10-16 MED ORDER — ROCURONIUM BROMIDE 50 MG/5ML IV SOLN
INTRAVENOUS | Status: AC
  Filled 2018-10-16: qty 1

## 2018-10-16 MED ORDER — CHLORHEXIDINE GLUCONATE CLOTH 2 % EX PADS: 6.0000 | MEDICATED_PAD | Freq: Once | CUTANEOUS | Status: DC

## 2018-10-16 MED ORDER — ONDANSETRON HCL 4 MG/2ML IJ SOLN
INTRAMUSCULAR | Status: DC | PRN
  Administered 2018-10-16: 4 mg via INTRAVENOUS

## 2018-10-16 MED ORDER — LACTATED RINGERS IV SOLN
INTRAVENOUS | Status: DC | PRN
  Administered 2018-10-16: 08:00:00 via INTRAVENOUS

## 2018-10-16 MED ORDER — OXYCODONE HCL 5 MG PO TABS
5.0000 mg | ORAL_TABLET | Freq: Once | ORAL | Status: AC | PRN
  Administered 2018-10-16: 5 mg via ORAL

## 2018-10-16 MED ORDER — ACETAMINOPHEN 500 MG PO TABS
1000.0000 mg | ORAL_TABLET | ORAL | Status: AC
  Administered 2018-10-16: 07:00:00 1000 mg via ORAL

## 2018-10-16 MED ORDER — PROPOFOL 10 MG/ML IV BOLUS
INTRAVENOUS | Status: DC | PRN
  Administered 2018-10-16: 160 mg via INTRAVENOUS

## 2018-10-16 MED ORDER — MIDAZOLAM HCL 2 MG/2ML IJ SOLN
INTRAMUSCULAR | Status: DC | PRN
  Administered 2018-10-16: 2 mg via INTRAVENOUS

## 2018-10-16 MED ORDER — MIDAZOLAM HCL 2 MG/2ML IJ SOLN
INTRAMUSCULAR | Status: AC
  Filled 2018-10-16: qty 2

## 2018-10-16 MED ORDER — OXYCODONE HCL 5 MG PO TABS
ORAL_TABLET | ORAL | Status: AC
  Filled 2018-10-16: qty 1

## 2018-10-16 MED ORDER — OXYCODONE HCL 5 MG/5ML PO SOLN: 5.0000 mg | Freq: Once | ORAL | Status: AC | PRN

## 2018-10-16 MED ORDER — PROPOFOL 10 MG/ML IV BOLUS
INTRAVENOUS | Status: AC
  Filled 2018-10-16: qty 20

## 2018-10-16 MED ORDER — HYDROCODONE-ACETAMINOPHEN 5-325 MG PO TABS: 1.0000 | ORAL_TABLET | Freq: Four times a day (QID) | ORAL | 0 refills | Status: DC | PRN

## 2018-10-16 MED ORDER — FAMOTIDINE 20 MG PO TABS
20.0000 mg | ORAL_TABLET | Freq: Once | ORAL | Status: AC
  Administered 2018-10-16: 07:00:00 20 mg via ORAL

## 2018-10-16 MED ORDER — FENTANYL CITRATE (PF) 100 MCG/2ML IJ SOLN
25.0000 ug | INTRAMUSCULAR | Status: DC | PRN
  Administered 2018-10-16 (×3): 25 ug via INTRAVENOUS

## 2018-10-16 MED ORDER — LACTATED RINGERS IV SOLN
INTRAVENOUS | Status: DC
  Administered 2018-10-16: 07:00:00 via INTRAVENOUS

## 2018-10-16 MED ORDER — FENTANYL CITRATE (PF) 100 MCG/2ML IJ SOLN
INTRAMUSCULAR | Status: DC | PRN
  Administered 2018-10-16: 50 ug via INTRAVENOUS

## 2018-10-16 MED ORDER — CELECOXIB 200 MG PO CAPS
ORAL_CAPSULE | ORAL | Status: AC
  Administered 2018-10-16: 07:00:00 200 mg via ORAL
  Filled 2018-10-16: qty 1

## 2018-10-16 MED ORDER — SUGAMMADEX SODIUM 200 MG/2ML IV SOLN
INTRAVENOUS | Status: DC | PRN
  Administered 2018-10-16: 200 mg via INTRAVENOUS

## 2018-10-16 MED ORDER — FENTANYL CITRATE (PF) 250 MCG/5ML IJ SOLN
INTRAMUSCULAR | Status: AC
  Filled 2018-10-16: qty 5

## 2018-10-16 MED ORDER — FENTANYL CITRATE (PF) 100 MCG/2ML IJ SOLN
INTRAMUSCULAR | Status: AC
  Administered 2018-10-16: 10:00:00 25 ug via INTRAVENOUS
  Filled 2018-10-16: qty 2

## 2018-10-16 MED ORDER — CELECOXIB 200 MG PO CAPS
200.0000 mg | ORAL_CAPSULE | ORAL | Status: AC
  Administered 2018-10-16: 07:00:00 200 mg via ORAL

## 2018-10-16 SURGICAL SUPPLY — 48 items
BACTOSHIELD CHG 4% 4OZ (MISCELLANEOUS) ×1
BASIN GRAD PLASTIC 32OZ STRL (MISCELLANEOUS) ×2 IMPLANT
BLADE SURG 15 STRL LF DISP TIS (BLADE) ×1 IMPLANT
BLADE SURG 15 STRL SS (BLADE) ×1
CANISTER SUCT 1200ML W/VALVE (MISCELLANEOUS) ×2 IMPLANT
CLIP VESOCCLUDE SM WIDE 6/CT (CLIP) ×2 IMPLANT
COVER WAND RF STERILE (DRAPES) ×2 IMPLANT
DERMABOND ADVANCED (GAUZE/BANDAGES/DRESSINGS) ×1
DERMABOND ADVANCED .7 DNX12 (GAUZE/BANDAGES/DRESSINGS) ×1 IMPLANT
DRAPE MAG INST 16X20 L/F (DRAPES) ×2 IMPLANT
DRAPE THYROID T SHEET (DRAPES) ×2 IMPLANT
ELECT CAUTERY BLADE TIP 2.5 (TIP) ×2
ELECT LARYNGEAL DUAL CHAN (ELECTRODE) IMPLANT
ELECT NEEDLE 20X.3 GREEN (MISCELLANEOUS)
ELECT REM PT RETURN 9FT ADLT (ELECTROSURGICAL) ×2
ELECTRODE CAUTERY BLDE TIP 2.5 (TIP) ×1 IMPLANT
ELECTRODE NEEDLE 20X.3 GREEN (MISCELLANEOUS) IMPLANT
ELECTRODE REM PT RTRN 9FT ADLT (ELECTROSURGICAL) ×1 IMPLANT
EVICEL AIRLESS SPRAY ACCES (MISCELLANEOUS) IMPLANT
GAUZE 4X4 16PLY RFD (DISPOSABLE) ×2 IMPLANT
GLOVE BIO SURGEON STRL SZ 6.5 (GLOVE) ×8 IMPLANT
GLOVE INDICATOR 7.0 STRL GRN (GLOVE) ×8 IMPLANT
GOWN STRL REUS W/ TWL LRG LVL3 (GOWN DISPOSABLE) ×4 IMPLANT
GOWN STRL REUS W/TWL LRG LVL3 (GOWN DISPOSABLE) ×4
KIT TURNOVER KIT A (KITS) ×2 IMPLANT
LABEL OR SOLS (LABEL) ×2 IMPLANT
NDL SAFETY ECLIPSE 18X1.5 (NEEDLE) ×4 IMPLANT
NEEDLE HYPO 18GX1.5 SHARP (NEEDLE) ×4
NEEDLE HYPO 25X1 1.5 SAFETY (NEEDLE) ×8 IMPLANT
NERVE STIMULATOR WAVEFORM 16S (NEUROSURGERY SUPPLIES)
NS IRRIG 500ML POUR BTL (IV SOLUTION) ×2 IMPLANT
PACK BASIN MINOR ARMC (MISCELLANEOUS) ×2 IMPLANT
PROBE NEUROSIGN BIPOL (MISCELLANEOUS) IMPLANT
PROBE NEUROSIGN BIPOLAR (MISCELLANEOUS)
SCRUB CHG 4% DYNA-HEX 4OZ (MISCELLANEOUS) ×1 IMPLANT
SHEARS HARMONIC 9CM CVD (BLADE) IMPLANT
SPONGE KITTNER 5P (MISCELLANEOUS) ×2 IMPLANT
STIMULATOR NERVE WAVEFORM 16S (NEUROSURGERY SUPPLIES) IMPLANT
STRIP CLOSURE SKIN 1/2X4 (GAUZE/BANDAGES/DRESSINGS) ×2 IMPLANT
SURGICEL SNOW 2X4 (HEMOSTASIS) ×2 IMPLANT
SUT MNCRL AB 4-0 PS2 18 (SUTURE) IMPLANT
SUT PROLENE 4 0 PS 2 18 (SUTURE) ×2 IMPLANT
SUT SILK 2 0 (SUTURE) ×1
SUT SILK 2-0 18XBRD TIE 12 (SUTURE) ×1 IMPLANT
SUT VIC AB 4-0 RB1 27 (SUTURE) ×1
SUT VIC AB 4-0 RB1 27X BRD (SUTURE) ×1 IMPLANT
SYR 3ML LL SCALE MARK (SYRINGE) ×8 IMPLANT
SYR BULB IRRIG 60ML STRL (SYRINGE) ×2 IMPLANT

## 2018-10-16 NOTE — Progress Notes (Signed)
Pt sat in chair. Pt used IS. O2 sats 92%-94%. With use of IS. Dr. Ola Spurr stated pt ok to go to Phase 2

## 2018-10-16 NOTE — Interval H&P Note (Signed)
History and Physical Interval Note:  10/16/2018 8:08 AM  Nicole Trevino  has presented today for surgery, with the diagnosis of PRIMARY HYPERPARATHYROIDISM.  The various methods of treatment have been discussed with the patient and family. After consideration of risks, benefits and other options for treatment, the patient has consented to  Procedure(s): PARATHYROIDECTOMY (N/A) as a surgical intervention.  The patient's history has been reviewed, patient examined, no change in status, stable for surgery.  I have reviewed the patient's chart and labs.  Questions were answered to the patient's satisfaction.     Fredirick Maudlin

## 2018-10-16 NOTE — Discharge Instructions (Signed)
Post-operative Home Care After Thyroid or Parathyroid Surgery  What should I expect after my operation?    You will see swelling under the incision and/or bruising under it in a few days. This is usually greatest on the second or third day after the operation. You may also feel the sensation of swelling and/or of firmness that can last for a month or more.     Neck incisions heal rapidly, within a week or two. You can get them damp after about 24 hours, however do not submerge the incision or allow it to become soaked or saturated with water or sweat for 2 weeks.    Your scar will be most visible 1-2 months after the operation and gradually fade over the next 6-8 months. As it heals, a scar looks more pink or red than the skin around it.    You may feel a firm healing ridge directly under the incision. This is normal and will soften and go away when healing is complete within 3-6 months.    All incisions are sensitive to sunlight.  For one year after surgery, you should use sunscreen when outdoors for long periods to prevent darkening of the scar area.     We recommend that you not expose the incision to the ultraviolet lights used in tanning booths.    Will my neck hurt?   Most patients experience very little pain, but you may feel some neck stiffness/soreness in your shoulder, back or neck and tension headache that takes a few days or weeks to go away completely.     Some patients also notice minor changes in swallowing, which improve over time.    The skin just above and below your incision will feel numb. This will improve over several months, but some persons may have a longterm decrease in sensation.     You may apply cold pack over your incision to improve the pain.    How will I manage my pain at home?   Take NSAIDS like ibuprofen (Motrin, Advil), naproxen (Naprosyn, Aleve) or acetaminophen (Tylenol) every 6 hours for the first 3-5 days after operation. This will  minimize the pain you feel.      ? To prevent Tylenol overdose, do not take a Tylenol doses at the same time as a combination narcotic dose that contains Tylenol, like Vicodin and Percocet. However, You may take them 4-6 hours apart.      If you have sore/stiff muscles in your back, shoulder or neck, you may use moist warm heat or heating pad on the affected areas 15-20 minutes at a time several times a day.    Gently massaging your neck muscles will improve the neck stiffness.     Do not be afraid to move your neck. Gently flexing and stretching your neck muscles will prevent stiffness.     Stronger pain medication or narcotic (like Vicodin, Percocet) for severe pain is rarely needed. If it is, however, DO NOT drive a car or drink alcohol while taking these medications.     Narcotics also cause constipation. Stool softeners (Colace) and fiber (fruits, bran, vegetables) and extra fluid intake helps. A stimulant laxative (Milk of Magnesia, Senokot) may be needed as well.    Will my voice be affected?  Your voice may be hoarse or weak at first, because the surgery took place near the voice box, but usually recovers within weeks. Some patients also notice a change in the pitch of their voices that affects  singing. Rarely, these changes can be permanent.   Are there any diet restrictions?  No, return to your previous diet and always eat a well balanced diet, low in fat, etc.    How will I care for my incision?   If you have paper steri strips on your incision, leave them in place until they begin to fall off naturally. If they become discolored or messy, you may remove them 7-10 days after your operation.    If you have a skin glue (Dermabond) closure, you may notice tiny pieces of yellow material on your washcloth. Any sutures (stitches) you may have are dissolvable and do not need to be removed.   You may shower then gently pat dry your incision.    Do not apply ointments or  powders.    Avoid using Vitamin E cream or other moisturizers on the incision until after your first follow-up visit.   What new medications might I take home?   ? Calcium supplement:  Your bodys calcium levels may fall after a total thyroidectomy or parathyroid operation. We recommend you purchase Os-Cal 500 (one tablet equals 500 mg of elemental calcium) or Citracal (2 tablets equal 630 mg of elemental calcium; the "Petites" version contains 500 mg elemental calcium per 2 tablets). You may be taking 3-6 (or more) tablets per day, depending on your doctors recommendation. You will need to take calcium at different times to avoid medication interaction. Ask your pharmacists, nurse, or doctor about specific interactions.   ? Thyroid Hormone:  If you have had a thyroid operation, you may be prescribed thyroid hormone replacement, called levothyroxine (Synthroid, Levothroid, etc.). A blood test will be done in 6-8 weeks to ensure the dosage is correct  by your doctor or your surgeon.   ? Vitamin D:  If your doctor has prescribed a Vitamin D supplement, like Calcitriol (Rocaltrol), try to get it filled at the hospital pharmacy before you leave.  Sometimes regular pharmacies do not stock it and will need to order it in.  When can I go back to normal activities?   You may return to work in 5-7 days or sooner if desired. Contact the clinic coordinator if you need employer forms completed.    You may drive as long as you are not taking any narcotics and your neck stiffness is resolved    Can I resume my previous medications?    Yes, unless directed not to by your doctor.    Before discharge, be sure to review your previous medications with your doctor or inpatient medical team.    When do I call for advice?   - If your temperature > 101.5  - You have trouble talking or breathing  - Your fingers/ hands or face or around your lips becomes numb and tingling. (This may mean your calcium level is  low.)  - You have trouble swallowing  - Your incision becomes swollen, red or drainage occurs.        Please start taking 1 tablets of either Os-Cal 500 or Citrical 3 times daily. You may use Tums, instead, but please be sure to get the "ultra" variety.  Calcium can be quite constipating, so be sure to drink at least 64 oz of water or other non-caffeinated beverage daily. You can still drink caffeine in addition to this.  If necessary, you may use over the counter stool softeners or fiber supplements to help with constipation.  If you experience any numbness or tingling in  your fingertips, at the tip of your nose, or around your mouth, this may be a sign of low calcium.  Chew 2-3 Tums every 30 minutes until it resolves.  If you find that you are having to do this frequently, please contact our office for new calcium instructions.  If the tingling does not resolve after 90 minutes to 2 hours, please contact our office for additional instructions.  You may also have been prescribed a prescription-strength vitamin D supplement. This is in addition to any vitamin D contained in the calcium supplement and it is OK to take both.     AMBULATORY SURGERY  DISCHARGE INSTRUCTIONS   1) The drugs that you were given will stay in your system until tomorrow so for the next 24 hours you should not:  A) Drive an automobile B) Make any legal decisions C) Drink any alcoholic beverage   2) You may resume regular meals tomorrow.  Today it is better to start with liquids and gradually work up to solid foods.  You may eat anything you prefer, but it is better to start with liquids, then soup and crackers, and gradually work up to solid foods.   3) Please notify your doctor immediately if you have any unusual bleeding, trouble breathing, redness and pain at the surgery site, drainage, fever, or pain not relieved by medication.    4) Additional Instructions:        Please contact your physician with  any problems or Same Day Surgery at [phone], Monday through Friday 6 am to 4 pm, or  at [facility] number at [phone].

## 2018-10-16 NOTE — Progress Notes (Signed)
Pt O2 sats between 88% to 92% on RA. Dr. Ola Spurr notifed. Acknowledged. Ordered IS.

## 2018-10-16 NOTE — Op Note (Signed)
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  This was consistent with preoperative imaging.  Intraoperative PTH levels are as follows: Baseline: 149 Pre-excision: 130 5-minute:57 10-minute: 42  This is a greater than 50% drop and represents adequate excision of a hyper secreting tissue.  Indications: Nicole Trevino is a 53 year old woman who has biochemical evidence of primary hyperparathyroidism.  She also has a number of secondary symptoms, the most bothersome to her is significant fatigue.  Due to her young age and symptoms, parathyroidectomy is recommended.  Preoperative imaging suggested a right inferior parathyroid adenoma.  The risks of surgery were discussed with her and she agreed to proceed.  Procedure In Detail: The patient was identified in the preoperative holding area and brought to the operating room where she was placed supine on the OR table.  All bony prominences were padded and bilateral sequential compression devices were placed on the lower extremities.  General endotracheal anesthesia was induced without incident.  The patient was then positioned appropriately for the operation and sterilely prepped and draped in standard fashion.  A timeout was performed confirming the patient's identity, the procedure being performed, her allergies, all necessary equipment was available, and that maintenance anesthesia was adequate.  A 5 cm transverse incision was made in a natural skin fold that was appropriately positioned for the operation.  This was carried down through the subcutaneous tissues and platysma  using electrocautery.  Subplatysmal flaps were elevated and the strap muscles were divided in the median raphae.  We elevated the sternohyoid off of the sternothyroid on the right to expose the internal jugular vein.  A baseline PTH level was drawn and sent to the lab.  The sternothyroid was then elevated off the thyroid tissue and retracted laterally.  We explored the inferior pole of the right lobe of the thyroid and almost immediately identified an enlarged parathyroid gland.  It was carefully dissected away from the surrounding tissues and elevated on its vascular pedicle that was then divided.  It was handed off as a specimen.  At that time of excision, a PTH level was drawn.  After 5 and 10 minutes, additional PTH levels were drawn.  These were all sent to the lab for analysis.  We irrigated our wound bed and obtain good hemostasis.  A Valsalva maneuver from the anesthesia team confirmed no ongoing surgical bleeding.  We applied SNoW for additional hemostatic effects.  The strap muscles were closed in the midline with running 4-0 Vicryl and the platysma was closed with interrupted Vicryl.  The skin was closed with running subcuticular Prolene.  We awaited the results of her PTH levels and these returned as discussed under findings.  The skin was cleaned and Dermabond and Steri-Strips were applied.  The Prolene suture was removed.  The patient was awakened, extubated, and taken to the postanesthesia care unit in good condition.  EBL: less than 5 cc  IVF: Please see anesthesia record  Specimen(s): Right inferior parathyroid gland to pathology for permanent section  Complications: none immediately apparent.   Counts: all needles, instruments, and sponges were counted and reported to be correct in number at the end of the case.   I was present for and participated  in the entire operation.  Fredirick Maudlin 10:25 AM

## 2018-10-16 NOTE — Anesthesia Preprocedure Evaluation (Addendum)
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  Past Surgical History: No date: CESAREAN SECTION     Comment:  x 2     Reproductive/Obstetrics negative OB ROS                            Anesthesia Physical Anesthesia Plan  ASA: III  Anesthesia Plan: General ETT   Post-op Pain Management:    Induction:   PONV Risk Score and Plan: Ondansetron, Dexamethasone, Midazolam and Treatment may vary due to age or medical condition  Airway Management Planned:   Additional Equipment:   Intra-op Plan:   Post-operative Plan:   Informed Consent: I have reviewed the patients History and Physical, chart, labs and discussed the procedure including the risks, benefits and alternatives for the proposed anesthesia with the patient or authorized representative who has indicated his/her  understanding and acceptance.     Dental Advisory Given  Plan Discussed with: Anesthesiologist and CRNA  Anesthesia Plan Comments:         Anesthesia Quick Evaluation

## 2018-10-16 NOTE — Anesthesia Post-op Follow-up Note (Signed)
Anesthesia QCDR form completed.        

## 2018-10-16 NOTE — Anesthesia Procedure Notes (Signed)
Procedure Name: Intubation Date/Time: 10/16/2018 8:41 AM Performed by: Justus Memory, CRNA Pre-anesthesia Checklist: Patient identified, Patient being monitored, Timeout performed, Emergency Drugs available and Suction available Patient Re-evaluated:Patient Re-evaluated prior to induction Oxygen Delivery Method: Circle system utilized Preoxygenation: Pre-oxygenation with 100% oxygen Induction Type: IV induction Ventilation: Mask ventilation with difficulty Laryngoscope Size: Mac, 3 and McGraph Grade View: Grade I Tube type: Oral Tube size: 7.0 mm Number of attempts: 1 Airway Equipment and Method: Stylet,  Video-laryngoscopy,  Rigid stylet and Oral airway Placement Confirmation: ETT inserted through vocal cords under direct vision,  positive ETCO2 and breath sounds checked- equal and bilateral Secured at: 21 cm Tube secured with: Tape Dental Injury: Teeth and Oropharynx as per pre-operative assessment  Difficulty Due To: Difficulty was anticipated, Difficult Airway- due to large tongue, Difficult Airway- due to anterior larynx and Difficult Airway- due to reduced neck mobility

## 2018-10-16 NOTE — Transfer of Care (Signed)
Immediate Anesthesia Transfer of Care Note  Patient: Nicole Trevino  Procedure(s) Performed: PARATHYROIDECTOMY (N/A )  Patient Location: PACU  Anesthesia Type:General  Level of Consciousness: sedated  Airway & Oxygen Therapy: Patient Spontanous Breathing and Patient connected to face mask oxygen  Post-op Assessment: Report given to RN and Post -op Vital signs reviewed and stable  Post vital signs: Reviewed and stable  Last Vitals:  Vitals Value Taken Time  BP 116/73 10/16/18 1021  Temp 36.2 C 10/16/18 1021  Pulse 77 10/16/18 1026  Resp 14 10/16/18 1026  SpO2 97 % 10/16/18 1026  Vitals shown include unvalidated device data.  Last Pain:  Vitals:   10/16/18 0728  TempSrc: Tympanic  PainSc: 0-No pain         Complications: No apparent anesthesia complications

## 2018-10-17 LAB — SURGICAL PATHOLOGY

## 2018-10-17 NOTE — Anesthesia Postprocedure Evaluation (Signed)
Anesthesia Post Note  Patient: Nicole Trevino  Procedure(s) Performed: PARATHYROIDECTOMY (N/A )  Patient location during evaluation: PACU Anesthesia Type: General Level of consciousness: awake and alert Pain management: pain level controlled Vital Signs Assessment: post-procedure vital signs reviewed and stable Respiratory status: spontaneous breathing, nonlabored ventilation and respiratory function stable Cardiovascular status: blood pressure returned to baseline and stable Postop Assessment: no apparent nausea or vomiting Anesthetic complications: no     Last Vitals:  Vitals:   10/16/18 1223 10/16/18 1353  BP: 123/82 123/79  Pulse: 76 72  Resp: 16 16  Temp: 36.7 C   SpO2: 95% 95%    Last Pain:  Vitals:   10/16/18 1353  TempSrc:   PainSc: 0-No pain                 Durenda Hurt

## 2018-11-07 ENCOUNTER — Encounter: Payer: Self-pay | Admitting: General Surgery

## 2018-11-07 ENCOUNTER — Ambulatory Visit (INDEPENDENT_AMBULATORY_CARE_PROVIDER_SITE_OTHER): Payer: PRIVATE HEALTH INSURANCE | Admitting: General Surgery

## 2018-11-07 ENCOUNTER — Other Ambulatory Visit: Payer: Self-pay

## 2018-11-07 VITALS — BP 157/94 | HR 74 | Temp 97.7°F | Resp 16 | Ht 64.0 in | Wt 254.8 lb

## 2018-11-07 DIAGNOSIS — E892 Postprocedural hypoparathyroidism: Secondary | ICD-10-CM

## 2018-11-07 DIAGNOSIS — Z9889 Other specified postprocedural states: Secondary | ICD-10-CM

## 2018-11-07 NOTE — Patient Instructions (Signed)
Please call if you have any questions or concerns.  °

## 2018-11-07 NOTE — Progress Notes (Signed)
Nicole Trevino is here today for a postoperative visit.  She underwent an uncomplicated minimally invasive parathyroidectomy on 16 October 2018.  A right inferior parathyroid adenoma was excised.  Intraoperatively, her PTH dropped from a baseline of 149 to a 10-minute post-excision level of 42.  Since her operation, she has been doing well.  She says that her fatigue is markedly improved and her energy level increased.  Her voice and swallowing are normal.  She denies any paresthesias or other symptoms of hypocalcemia.  She is currently taking Os-Cal 501 tablet 3 times daily.  Today's Vitals   11/07/18 1612  BP: (!) 157/94  Pulse: 74  Resp: 16  Temp: 97.7 F (36.5 C)  TempSrc: Temporal  SpO2: 97%  Weight: 254 lb 12.8 oz (115.6 kg)  Height: 5\' 4"  (1.626 m)  PainSc: 0-No pain   Body mass index is 43.74 kg/m. Focused examination: The Steri-Strips were still in place.  These were removed to reveal a well approximated transverse cervical incision.  There is no erythema, induration, or drainage present.  There is a slight healing ridge beneath the surface.  Impression and plan: Nicole Trevino is a 53 year old woman who underwent parathyroidectomy for primary hyperparathyroidism.  She has done extremely well in the postoperative period.  Today, I instructed her in scar massage, using a topical emollient agent and pressure to massage the scar line.  This will help with overall cosmesis.  She should also protect the incision from ultraviolet exposure for the next year to avoid hyperpigmentation or other alterations in color.  She may decrease her calcium to 2 tablets once per day which I have suggested she remain on for bone health.  She has no other ongoing endocrine surgical needs at this time, and I will return her to the care of her regular providers.

## 2019-04-09 ENCOUNTER — Other Ambulatory Visit: Payer: Self-pay | Admitting: Family Medicine

## 2019-04-09 DIAGNOSIS — Z1231 Encounter for screening mammogram for malignant neoplasm of breast: Secondary | ICD-10-CM

## 2019-08-29 ENCOUNTER — Other Ambulatory Visit: Payer: Self-pay

## 2019-08-29 ENCOUNTER — Ambulatory Visit
Admission: RE | Admit: 2019-08-29 | Discharge: 2019-08-29 | Disposition: A | Discharge status: Home / Self Care | Payer: PRIVATE HEALTH INSURANCE | Source: Ambulatory Visit | Attending: Family Medicine | Admitting: Family Medicine

## 2019-08-29 DIAGNOSIS — Z1231 Encounter for screening mammogram for malignant neoplasm of breast: Secondary | ICD-10-CM

## 2020-02-07 ENCOUNTER — Other Ambulatory Visit: Payer: Self-pay

## 2020-02-07 ENCOUNTER — Other Ambulatory Visit: Payer: PRIVATE HEALTH INSURANCE

## 2020-02-07 DIAGNOSIS — Z20822 Contact with and (suspected) exposure to covid-19: Secondary | ICD-10-CM

## 2020-02-10 LAB — NOVEL CORONAVIRUS, NAA: SARS-CoV-2, NAA: DETECTED — AB

## 2020-04-05 NOTE — Progress Notes (Signed)
Came through the covid testing site. Was positive covid on 02/07/2020 as evidence by the Epic results. Per policy, patient does not need to be retested for covid due to being within 90 days. Patient informed of this and was not retested for her upcoming procedure.

## 2020-04-06 ENCOUNTER — Encounter: Payer: Self-pay | Admitting: Internal Medicine

## 2020-04-07 ENCOUNTER — Other Ambulatory Visit
Admission: RE | Admit: 2020-04-07 | Discharge: 2020-04-07 | Disposition: A | Discharge status: Home / Self Care | Payer: PRIVATE HEALTH INSURANCE | Source: Ambulatory Visit | Attending: Gastroenterology | Admitting: Gastroenterology

## 2020-04-07 ENCOUNTER — Ambulatory Visit
Admission: RE | Admit: 2020-04-07 | Discharge: 2020-04-07 | Disposition: A | Discharge status: Home / Self Care | Payer: PRIVATE HEALTH INSURANCE | Attending: Internal Medicine | Admitting: Internal Medicine

## 2020-04-07 ENCOUNTER — Encounter: Payer: Self-pay | Admitting: Internal Medicine

## 2020-04-07 ENCOUNTER — Ambulatory Visit: Payer: PRIVATE HEALTH INSURANCE | Admitting: Anesthesiology

## 2020-04-07 ENCOUNTER — Encounter
Admission: RE | Disposition: A | Discharge status: Home / Self Care | Payer: Self-pay | Source: Home / Self Care | Attending: Internal Medicine

## 2020-04-07 ENCOUNTER — Other Ambulatory Visit: Payer: Self-pay

## 2020-04-07 DIAGNOSIS — I252 Old myocardial infarction: Secondary | ICD-10-CM | POA: Diagnosis not present

## 2020-04-07 DIAGNOSIS — Z1211 Encounter for screening for malignant neoplasm of colon: Secondary | ICD-10-CM | POA: Insufficient documentation

## 2020-04-07 DIAGNOSIS — K5730 Diverticulosis of large intestine without perforation or abscess without bleeding: Secondary | ICD-10-CM | POA: Insufficient documentation

## 2020-04-07 DIAGNOSIS — Z7984 Long term (current) use of oral hypoglycemic drugs: Secondary | ICD-10-CM | POA: Insufficient documentation

## 2020-04-07 DIAGNOSIS — Z881 Allergy status to other antibiotic agents status: Secondary | ICD-10-CM | POA: Diagnosis not present

## 2020-04-07 DIAGNOSIS — Z79899 Other long term (current) drug therapy: Secondary | ICD-10-CM | POA: Diagnosis not present

## 2020-04-07 DIAGNOSIS — Z7982 Long term (current) use of aspirin: Secondary | ICD-10-CM | POA: Diagnosis not present

## 2020-04-07 DIAGNOSIS — I10 Essential (primary) hypertension: Secondary | ICD-10-CM | POA: Diagnosis not present

## 2020-04-07 DIAGNOSIS — Z888 Allergy status to other drugs, medicaments and biological substances status: Secondary | ICD-10-CM | POA: Insufficient documentation

## 2020-04-07 DIAGNOSIS — K640 First degree hemorrhoids: Secondary | ICD-10-CM | POA: Insufficient documentation

## 2020-04-07 DIAGNOSIS — Z87891 Personal history of nicotine dependence: Secondary | ICD-10-CM | POA: Insufficient documentation

## 2020-04-07 HISTORY — PX: COLONOSCOPY: SHX5424

## 2020-04-07 HISTORY — DX: Anxiety disorder, unspecified: F41.9

## 2020-04-07 HISTORY — DX: Prediabetes: R73.03

## 2020-04-07 HISTORY — DX: Primary hyperparathyroidism: E21.0

## 2020-04-07 HISTORY — DX: Unspecified abnormal cytological findings in specimens from vagina: R87.629

## 2020-04-07 LAB — GLUCOSE, CAPILLARY: Glucose-Capillary: 157 mg/dL — ABNORMAL HIGH (ref 70–99)

## 2020-04-07 SURGERY — COLONOSCOPY
Anesthesia: General

## 2020-04-07 MED ORDER — PROPOFOL 500 MG/50ML IV EMUL
INTRAVENOUS | Status: DC | PRN
  Administered 2020-04-07: 120 ug/kg/min via INTRAVENOUS

## 2020-04-07 MED ORDER — SODIUM CHLORIDE 0.9 % IV SOLN: INTRAVENOUS | Status: DC

## 2020-04-07 MED ORDER — PROPOFOL 10 MG/ML IV BOLUS
INTRAVENOUS | Status: DC | PRN
  Administered 2020-04-07: 100 mg via INTRAVENOUS

## 2020-04-07 MED ORDER — DEXMEDETOMIDINE (PRECEDEX) IN NS 20 MCG/5ML (4 MCG/ML) IV SYRINGE
PREFILLED_SYRINGE | INTRAVENOUS | Status: AC
  Filled 2020-04-07: qty 5

## 2020-04-07 MED ORDER — PROPOFOL 10 MG/ML IV BOLUS
INTRAVENOUS | Status: AC
  Filled 2020-04-07: qty 20

## 2020-04-07 MED ORDER — PROPOFOL 500 MG/50ML IV EMUL
INTRAVENOUS | Status: AC
  Filled 2020-04-07: qty 50

## 2020-04-07 MED ORDER — DEXMEDETOMIDINE (PRECEDEX) IN NS 20 MCG/5ML (4 MCG/ML) IV SYRINGE
PREFILLED_SYRINGE | INTRAVENOUS | Status: DC | PRN
  Administered 2020-04-07: 12 ug via INTRAVENOUS

## 2020-04-07 NOTE — H&P (Signed)
Outpatient short stay form Pre-procedure 04/07/2020 8:54 AM Nicole Trevino K. Alice Reichert, M.D.  Primary Physician: Salome Holmes, M.D.  Reason for visit:  Colon cancer screening  History of present illness:  Patient presents for colonoscopy for colon cancer screening. The patient denies complaints of abdominal pain, significant change in bowel habits, or rectal bleeding.      Current Facility-Administered Medications:  .  0.9 %  sodium chloride infusion, , Intravenous, Continuous, [place], Benay Pike, MD, Last Rate: 20 mL/hr at 04/07/20 0736, New Bag at 04/07/20 0736  Medications Prior to Admission  Medication Sig Dispense Refill Last Dose  . acetaminophen (TYLENOL) 500 MG tablet Take 1,000 mg by mouth every 6 (six) hours as needed for moderate pain or headache.   Past Week at Unknown time  . acyclovir (ZOVIRAX) 400 MG tablet Take 400 mg by mouth See admin instructions. Take 400 mg by mouth twice daily, take 400 mg by mouth 5 times daily as needed for fever blister flare   04/06/2020 at 0800  . aspirin EC 81 MG tablet Take 81 mg by mouth daily.   04/06/2020 at 0800  . atorvastatin (LIPITOR) 80 MG tablet Take 40 mg by mouth daily.    04/06/2020 at 1800  . co-enzyme Q-10 30 MG capsule Take 10 mg by mouth daily.   04/06/2020 at 0800  . metFORMIN (GLUCOPHAGE) 500 MG tablet Take by mouth daily with breakfast.   04/06/2020 at 1800  . metoprolol tartrate (LOPRESSOR) 25 MG tablet Take 12.5 mg by mouth 2 (two) times daily.    04/06/2020 at 1800  . metroNIDAZOLE (METROGEL) 1 % gel Apply topically daily.   04/06/2020 at Unknown time  . olmesartan (BENICAR) 20 MG tablet Take 20 mg by mouth daily.   04/07/2020 at 0500  . ergocalciferol (VITAMIN D2) 1.25 MG (50000 UT) capsule Take 50,000 Units by mouth every Sunday.  (Patient not taking: Reported on 04/07/2020)   Not Taking at Unknown time  . Magnesium 400 MG TABS Take 400 mg by mouth daily. (Patient not taking: Reported on 04/07/2020)   Not Taking at Unknown time  . nitroGLYCERIN  (NITROSTAT) 0.4 MG SL tablet Place 0.4 mg under the tongue every 5 (five) minutes as needed for chest pain. (Patient not taking: Reported on 04/07/2020)   Not Taking at Unknown time     Allergies  Allergen Reactions  . Lisinopril Cough  . Procardia [Nifedipine] Swelling  . Cefuroxime Axetil Hives and Rash  . Erythromycin Hives and Rash     Past Medical History:  Diagnosis Date  . Abnormal vaginal Pap smear   . Anxiety   . Depression   . Herpes    nasal and oral area  . Hyperparathyroidism, primary ([place])   . Hypertension   . Myocardial infarction ([place])    2015 scad spontaneuos coronary artety dissection  . Prediabetes   . Sleep apnea    severe, per patient.  Marland Kitchen Spontaneous dissection of coronary artery 2015  . Spontaneous dissection of coronary artery     Review of systems:  Otherwise negative.    Physical Exam  Gen: Alert, oriented. Appears stated age.  HEENT: [place]/AT. PERRLA. Lungs: CTA, no wheezes. CV: RR nl S1, S2. Abd: soft, benign, no masses. BS+ Ext: No edema. Pulses 2+    Planned procedures: Proceed with colonoscopy. The patient understands the nature of the planned procedure, indications, risks, alternatives and potential complications including but not limited to bleeding, infection, perforation, damage to internal organs and possible oversedation/side effects  from anesthesia. The patient agrees and gives consent to proceed.  Please refer to procedure notes for findings, recommendations and patient disposition/instructions.     Nyree Applegate K. Alice Reichert, M.D. Gastroenterology 04/07/2020  8:54 AM

## 2020-04-07 NOTE — Anesthesia Preprocedure Evaluation (Signed)
Anesthesia Evaluation  Patient identified by MRN, date of birth, ID band Patient awake    Reviewed: Allergy & Precautions, NPO status , Patient's Chart, lab work & pertinent test results  History of Anesthesia Complications Negative for: history of anesthetic complications  Airway Mallampati: II  TM Distance: >3 FB Neck ROM: Full    Dental no notable dental hx.    Pulmonary sleep apnea and Continuous Positive Airway Pressure Ventilation , neg COPD, former smoker,    breath sounds clear to auscultation- rhonchi (-) wheezing      Cardiovascular Exercise Tolerance: Good hypertension, Pt. on medications + Past MI  (-) Cardiac Stents and (-) CABG  Rhythm:Regular Rate:Normal - Systolic murmurs and - Diastolic murmurs    Neuro/Psych neg Seizures PSYCHIATRIC DISORDERS Anxiety Depression negative neurological ROS     GI/Hepatic negative GI ROS, Neg liver ROS,   Endo/Other  negative endocrine ROSneg diabetes  Renal/GU negative Renal ROS     Musculoskeletal negative musculoskeletal ROS (+)   Abdominal (+) + obese,   Peds  Hematology negative hematology ROS (+)   Anesthesia Other Findings Past Medical History: No date: Abnormal vaginal Pap smear No date: Anxiety No date: Depression No date: Herpes     Comment:  nasal and oral area No date: Hyperparathyroidism, primary ([place]) No date: Hypertension No date: Myocardial infarction [facility])     Comment:  2015 scad spontaneuos coronary artety dissection No date: Prediabetes No date: Sleep apnea     Comment:  severe, per patient. 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 No date: Spontaneous dissection of coronary artery   Reproductive/Obstetrics                             Anesthesia Physical Anesthesia Plan  ASA: III  Anesthesia Plan: General   Post-op Pain Management:    Induction: Intravenous  PONV Risk Score and Plan: 2 and  Propofol infusion  Airway Management Planned: Natural Airway  Additional Equipment:   Intra-op Plan:   Post-operative Plan:   Informed Consent: I have reviewed the patients History and Physical, chart, labs and discussed the procedure including the risks, benefits and alternatives for the proposed anesthesia with the patient or authorized representative who has indicated his/her understanding and acceptance.     Dental advisory given  Plan Discussed with: CRNA and Anesthesiologist  Anesthesia Plan Comments:         Anesthesia Quick Evaluation

## 2020-04-07 NOTE — Transfer of Care (Signed)
Immediate Anesthesia Transfer of Care Note  Patient: Nicole Trevino  Procedure(s) Performed: COLONOSCOPY (N/A )  Patient Location: PACU and Endoscopy Unit  Anesthesia Type:General  Level of Consciousness: drowsy and patient cooperative  Airway & Oxygen Therapy: Patient Spontanous Breathing  Post-op Assessment: Report given to RN and Post -op Vital signs reviewed and stable  Post vital signs: Reviewed and stable  Last Vitals:  Vitals Value Taken Time  BP 111/77 04/07/20 0922  Temp 36.2 C 04/07/20 0922  Pulse 97 04/07/20 0926  Resp 20 04/07/20 0926  SpO2 95 % 04/07/20 0926  Vitals shown include unvalidated device data.  Last Pain:  Vitals:   04/07/20 0922  TempSrc: Temporal  PainSc: 0-No pain         Complications: No complications documented.

## 2020-04-07 NOTE — Interval H&P Note (Signed)
History and Physical Interval Note:  04/07/2020 8:54 AM  Nicole Trevino  has presented today for surgery, with the diagnosis of COLON CANCER SCREENING.  The various methods of treatment have been discussed with the patient and family. After consideration of risks, benefits and other options for treatment, the patient has consented to  Procedure(s) with comments: COLONOSCOPY (N/A) - COVID POSITIVE 02/07/2020 as a surgical intervention.  The patient's history has been reviewed, patient examined, no change in status, stable for surgery.  I have reviewed the patient's chart and labs.  Questions were answered to the patient's satisfaction.     [place], [place]

## 2020-04-07 NOTE — Op Note (Signed)
[facility] Patient Name: Nicole Trevino Procedure Date: 04/07/2020 7:53 AM MRN: [id number] Account #: [id number] Date of Birth: [birth year] Admit Type: Outpatient Age: 55 Room: [facility] ENDO ROOM 2 Gender: Female Note Status: Finalized Procedure:             Colonoscopy Indications:           Screening for colorectal malignant neoplasm Providers:             Benay Pike. Alice Reichert MD, MD Referring MD:          Rubbie Battiest. Iona Beard MD, MD (Referring MD) Medicines:             Propofol per Anesthesia Complications:         No immediate complications. Procedure:             Pre-Anesthesia Assessment:                        - The risks and benefits of the procedure and the                         sedation options and risks were discussed with the                         patient. All questions were answered and informed                         consent was obtained.                        - Patient identification and proposed procedure were                         verified prior to the procedure by the nurse. The                         procedure was verified in the procedure room.                        - ASA Grade Assessment: III - A patient with severe                         systemic disease.                        - After reviewing the risks and benefits, the patient                         was deemed in satisfactory condition to undergo the                         procedure.                        After obtaining informed consent, the colonoscope was                         passed under direct vision. Throughout the procedure,                         the patient's blood  pressure, pulse, and oxygen                         saturations were monitored continuously. The                         Colonoscope was introduced through the anus and                         advanced to the the cecum, identified by appendiceal                         orifice and ileocecal  valve. The colonoscopy was                         performed without difficulty. The patient tolerated                         the procedure well. The quality of the bowel                         preparation was excellent. The ileocecal valve,                         appendiceal orifice, and rectum were photographed. Findings:      The perianal and digital rectal examinations were normal. Pertinent       negatives include normal sphincter tone and no palpable rectal lesions.      Non-bleeding internal hemorrhoids were found during retroflexion. The       hemorrhoids were Grade I (internal hemorrhoids that do not prolapse).      Multiple small and large-mouthed diverticula were found in the left       colon. There was no evidence of diverticular bleeding.      The exam was otherwise without abnormality. Impression:            - Non-bleeding internal hemorrhoids.                        - Moderate diverticulosis in the left colon. There was                         no evidence of diverticular bleeding.                        - The examination was otherwise normal.                        - No specimens collected. Recommendation:        - Patient has a contact number available for                         emergencies. The signs and symptoms of potential                         delayed complications were discussed with the patient.                         Return to normal activities tomorrow. Written  discharge instructions were provided to the patient.                        - Resume previous diet.                        - Continue present medications.                        - Repeat colonoscopy in 10 years for screening                         purposes.                        - Return to GI office PRN.                        - The findings and recommendations were discussed with                         the patient. Procedure Code(s):     --- Professional ---                         V7846, Colorectal cancer screening; colonoscopy on                         individual not meeting criteria for high risk Diagnosis Code(s):     --- Professional ---                        K57.30, Diverticulosis of large intestine without                         perforation or abscess without bleeding                        K64.0, First degree hemorrhoids                        Z12.11, Encounter for screening for malignant neoplasm                         of colon CPT copyright 2019 [employer]. All rights reserved. The codes documented in this report are preliminary and upon coder review may  be revised to meet current compliance requirements. Efrain Sella MD, MD 04/07/2020 9:22:11 AM This report has been signed electronically. Number of Addenda: 0 Note Initiated On: 04/07/2020 7:53 AM Scope Withdrawal Time: 0 hours 5 minutes 32 seconds  Total Procedure Duration: 0 hours 9 minutes 3 seconds  Estimated Blood Loss:  Estimated blood loss: none.      [facility]

## 2020-04-07 NOTE — Anesthesia Postprocedure Evaluation (Signed)
Anesthesia Post Note  Patient: Nicole Trevino  Procedure(s) Performed: COLONOSCOPY (N/A )  Patient location during evaluation: Endoscopy Anesthesia Type: General Level of consciousness: awake and alert and oriented Pain management: pain level controlled Vital Signs Assessment: post-procedure vital signs reviewed and stable Respiratory status: spontaneous breathing, nonlabored ventilation and respiratory function stable Cardiovascular status: blood pressure returned to baseline and stable Postop Assessment: no signs of nausea or vomiting Anesthetic complications: no   No complications documented.   Last Vitals:  Vitals:   04/07/20 0932 04/07/20 0942  BP: 123/87 128/73  Pulse: 92 78  Resp: 16 19  Temp:    SpO2: 96% 95%    Last Pain:  Vitals:   04/07/20 0942  TempSrc:   PainSc: 0-No pain                 Aurore Redinger

## 2020-04-09 ENCOUNTER — Encounter: Payer: Self-pay | Admitting: Internal Medicine

## 2020-08-20 ENCOUNTER — Other Ambulatory Visit: Payer: Self-pay | Admitting: Family Medicine

## 2020-08-20 DIAGNOSIS — Z1231 Encounter for screening mammogram for malignant neoplasm of breast: Secondary | ICD-10-CM

## 2020-08-27 ENCOUNTER — Ambulatory Visit
Admission: RE | Admit: 2020-08-27 | Discharge: 2020-08-27 | Disposition: A | Discharge status: Home / Self Care | Payer: PRIVATE HEALTH INSURANCE | Source: Ambulatory Visit | Attending: Family Medicine | Admitting: Family Medicine

## 2020-08-27 ENCOUNTER — Other Ambulatory Visit: Payer: Self-pay

## 2020-08-27 DIAGNOSIS — Z1231 Encounter for screening mammogram for malignant neoplasm of breast: Secondary | ICD-10-CM | POA: Insufficient documentation

## 2020-11-08 ENCOUNTER — Encounter: Payer: Self-pay | Admitting: General Surgery

## 2021-08-04 ENCOUNTER — Other Ambulatory Visit: Payer: Self-pay | Admitting: Family Medicine

## 2021-08-04 DIAGNOSIS — Z1231 Encounter for screening mammogram for malignant neoplasm of breast: Secondary | ICD-10-CM

## 2021-09-02 ENCOUNTER — Ambulatory Visit
Admission: RE | Admit: 2021-09-02 | Discharge: 2021-09-02 | Disposition: A | Discharge status: Home / Self Care | Payer: PRIVATE HEALTH INSURANCE | Source: Ambulatory Visit | Attending: Family Medicine | Admitting: Family Medicine

## 2021-09-02 DIAGNOSIS — Z1231 Encounter for screening mammogram for malignant neoplasm of breast: Secondary | ICD-10-CM | POA: Diagnosis present

## 2022-06-11 IMAGING — MG MM DIGITAL SCREENING BILAT W/ TOMO AND CAD
6 of 10 series · 6 of 30 positions shown · non-contrast
Comparison: Previous exam(s).

CLINICAL DATA: Screening.

EXAM:
DIGITAL SCREENING BILATERAL MAMMOGRAM WITH TOMOSYNTHESIS AND CAD
TECHNIQUE: Bilateral screening digital craniocaudal and mediolateral oblique
mammograms were obtained. Bilateral screening digital breast
tomosynthesis was performed. The images were evaluated with
computer-aided detection.

[L CC synth-2D]
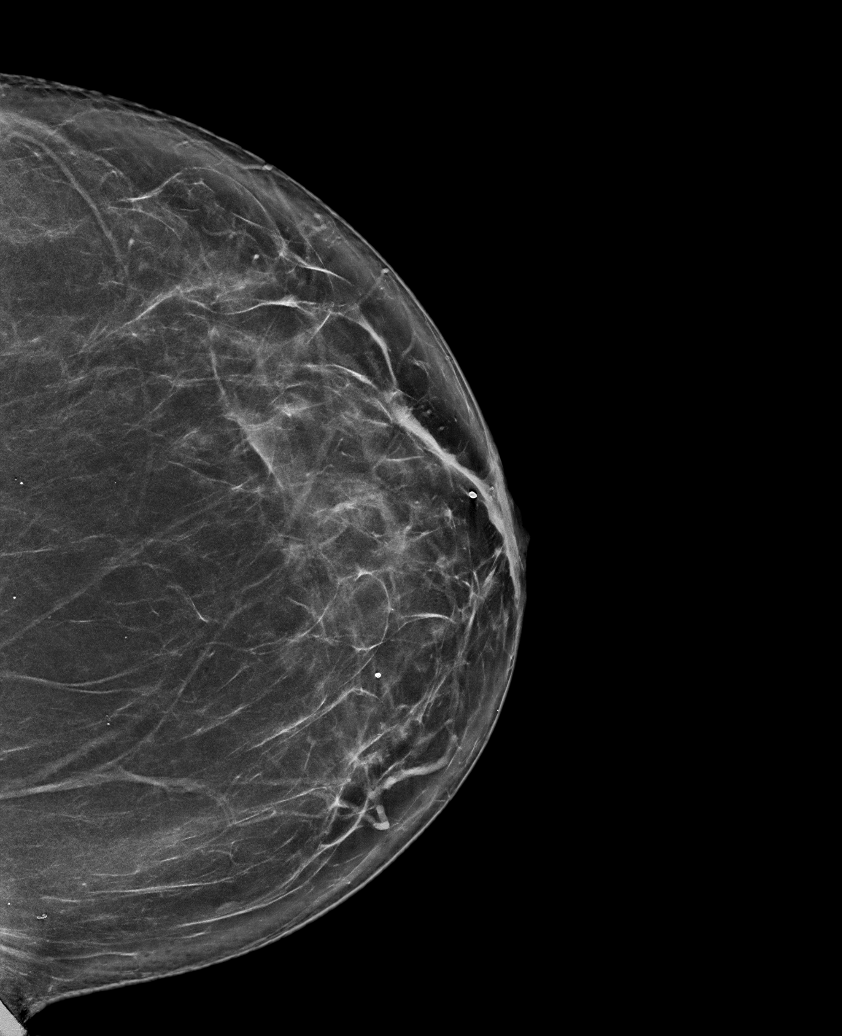

[R CC synth-2D]
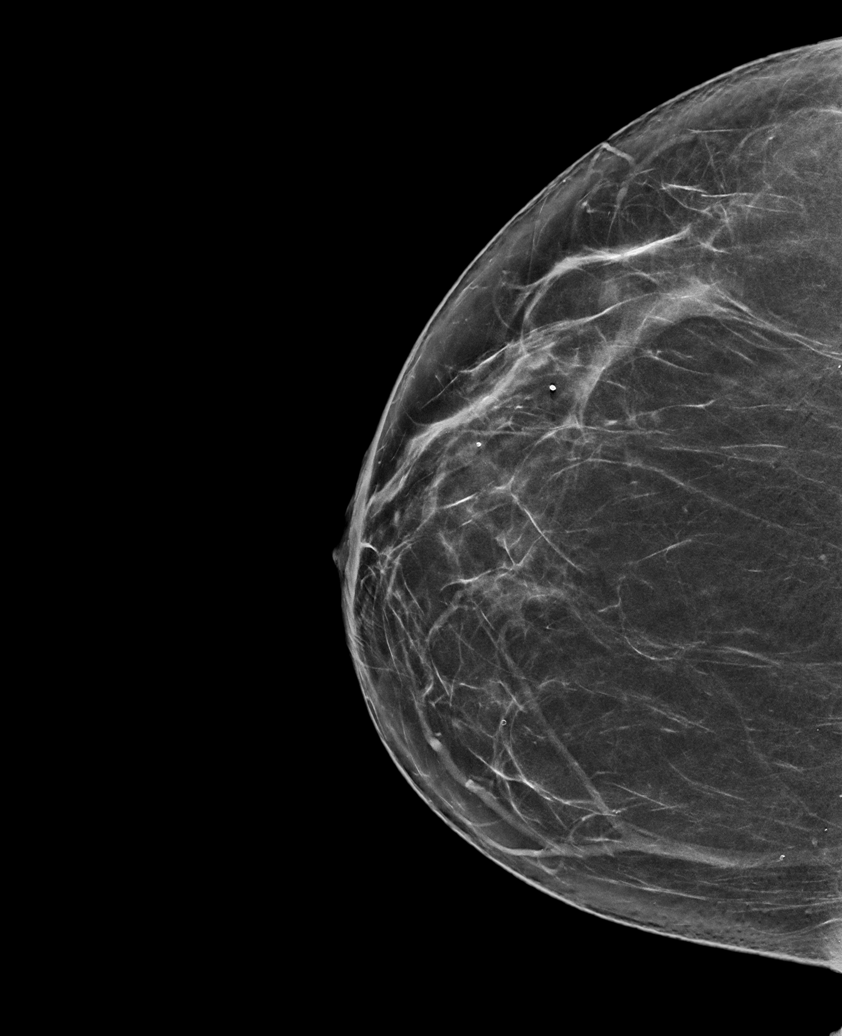

[R MLO synth-2D]
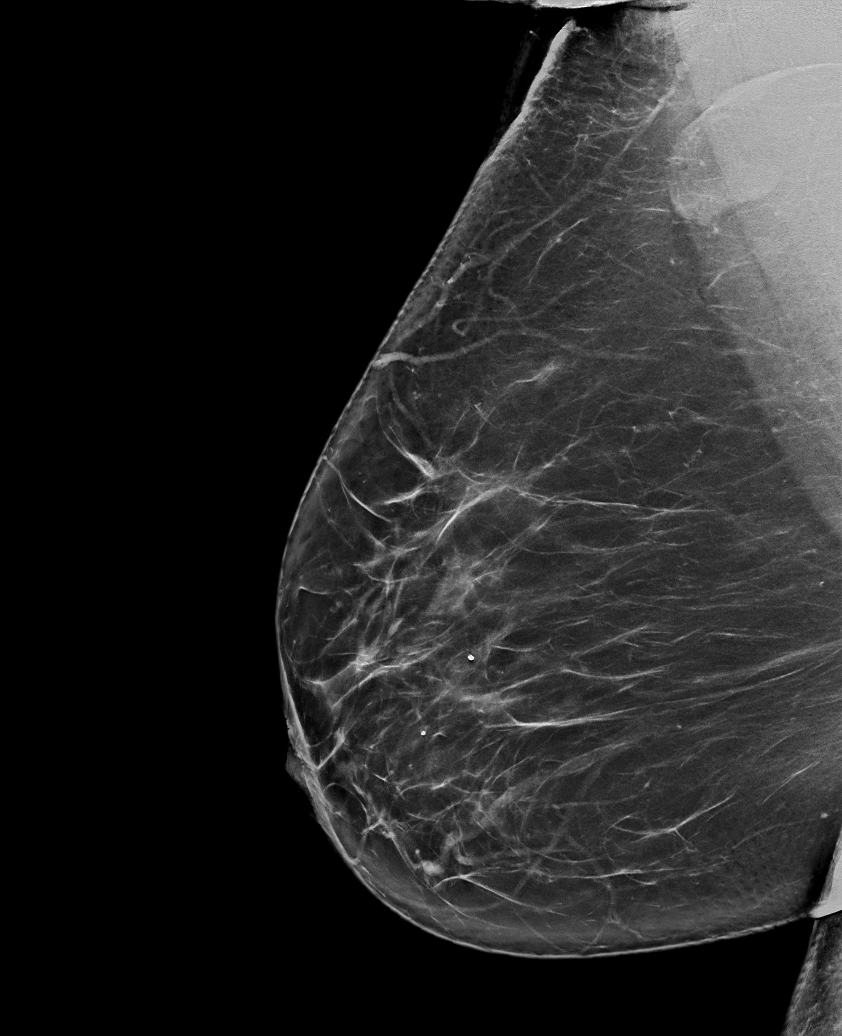

[L MLO synth-2D (1 of 2)]
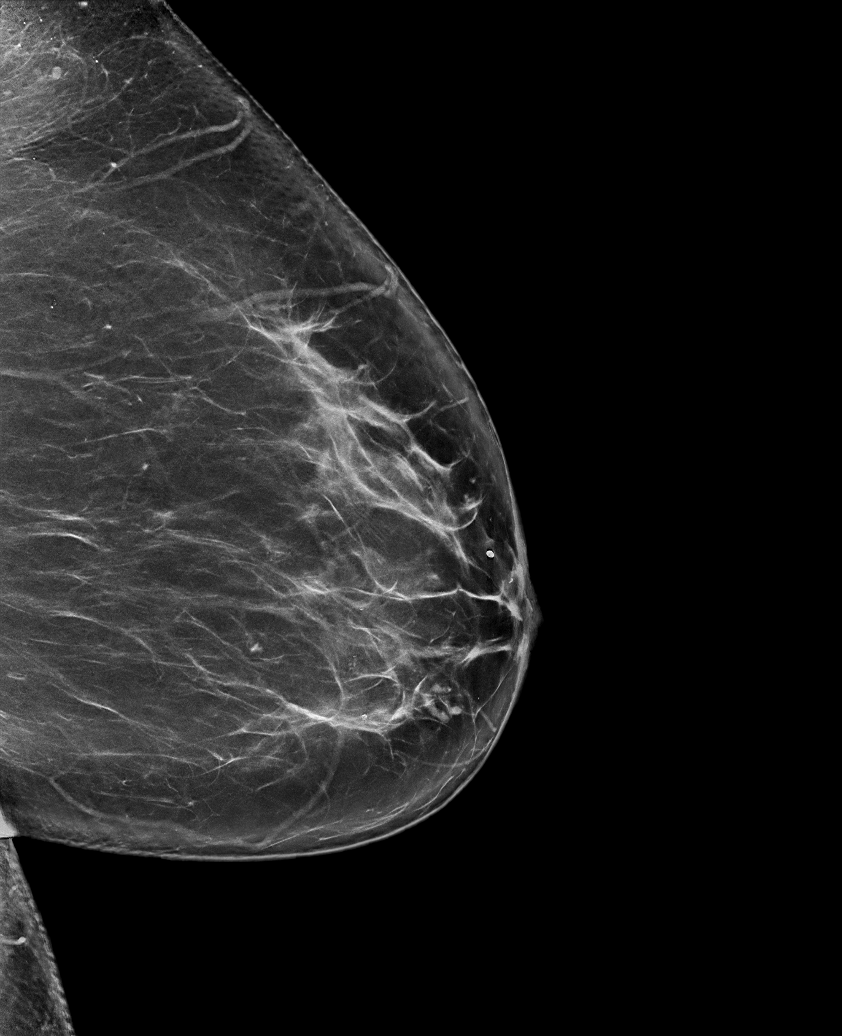

[L MLO synth-2D (2 of 2)]
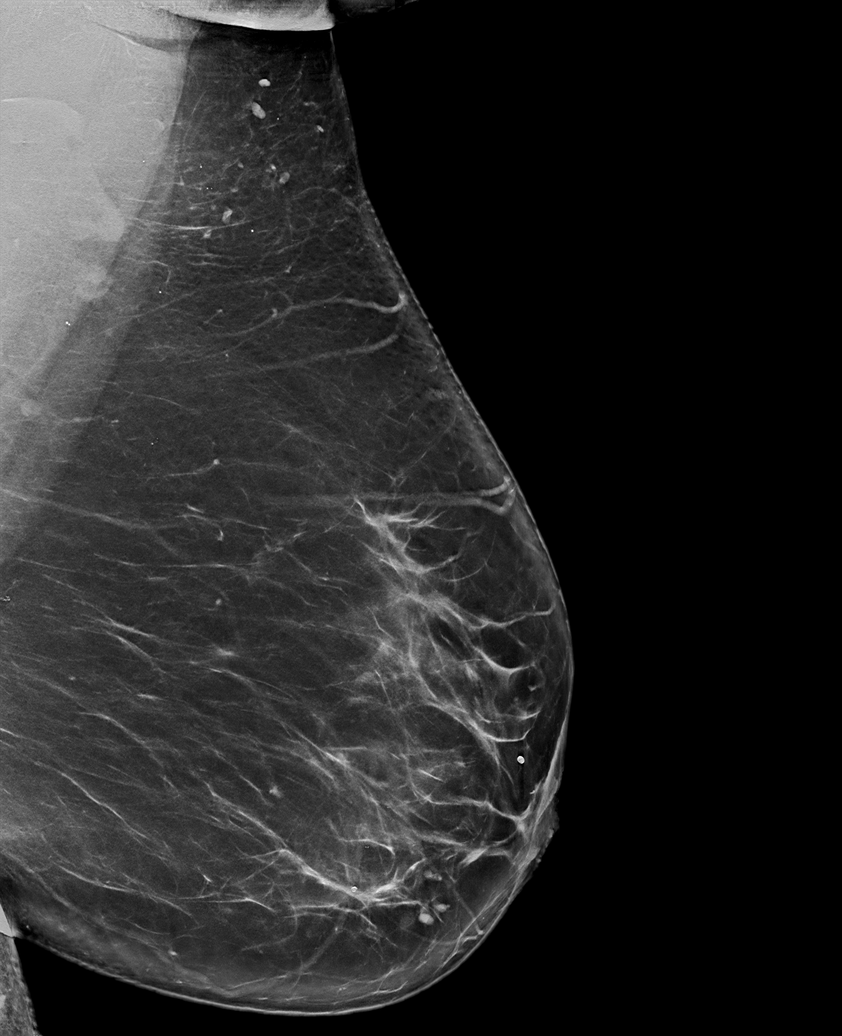

[R CC tomo · tomo slice 39/77.0]
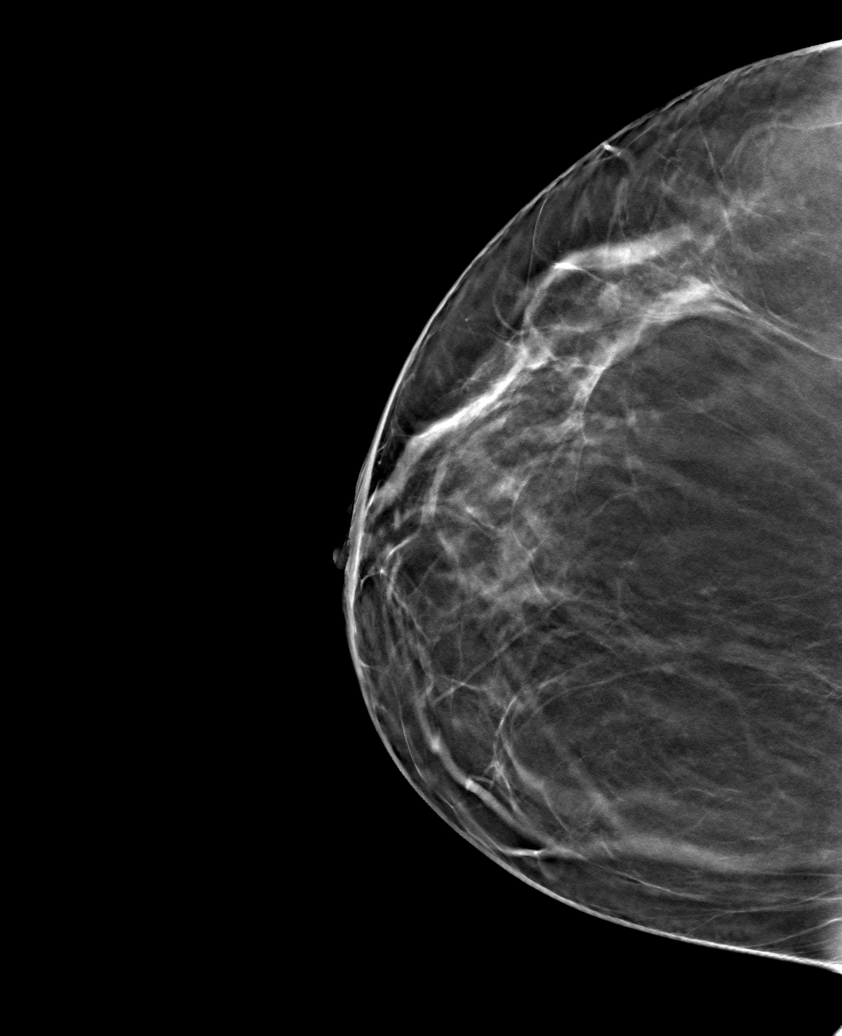

[6 of 30 positions shown; findings below may reference images not displayed]

ACR Breast Density Category b: There are scattered areas of
fibroglandular density.
FINDINGS: There are no findings suspicious for malignancy.
IMPRESSION: No mammographic evidence of malignancy. A result letter of this
screening mammogram will be mailed directly to the patient.

RECOMMENDATION:
Screening mammogram in one year. (Code:51-O-LD2)

BI-RADS CATEGORY  1: Negative.

## 2022-08-18 ENCOUNTER — Other Ambulatory Visit: Payer: Self-pay | Admitting: Family Medicine

## 2022-08-18 DIAGNOSIS — Z1231 Encounter for screening mammogram for malignant neoplasm of breast: Secondary | ICD-10-CM

## 2022-09-08 ENCOUNTER — Ambulatory Visit
Admission: RE | Admit: 2022-09-08 | Discharge: 2022-09-08 | Disposition: A | Discharge status: Home / Self Care | Payer: PRIVATE HEALTH INSURANCE | Source: Ambulatory Visit | Attending: Family Medicine | Admitting: Family Medicine

## 2022-09-08 DIAGNOSIS — Z1231 Encounter for screening mammogram for malignant neoplasm of breast: Secondary | ICD-10-CM | POA: Diagnosis not present

## 2023-05-11 ENCOUNTER — Other Ambulatory Visit: Payer: Self-pay | Admitting: Family Medicine

## 2023-05-11 DIAGNOSIS — Z1231 Encounter for screening mammogram for malignant neoplasm of breast: Secondary | ICD-10-CM

## 2023-09-14 ENCOUNTER — Ambulatory Visit
Admission: RE | Admit: 2023-09-14 | Discharge: 2023-09-14 | Disposition: A | Discharge status: Home / Self Care | Payer: PRIVATE HEALTH INSURANCE | Source: Ambulatory Visit | Attending: Family Medicine | Admitting: Family Medicine

## 2023-09-14 DIAGNOSIS — Z1231 Encounter for screening mammogram for malignant neoplasm of breast: Secondary | ICD-10-CM | POA: Insufficient documentation
